# Patient Record
Sex: Female | Born: 1965
Health system: Southern US, Community
[De-identification: ages and names within clinical notes are randomized; demographics above are authoritative.]

## PROBLEM LIST (undated history)

## (undated) DIAGNOSIS — M1990 Unspecified osteoarthritis, unspecified site: Secondary | ICD-10-CM

## (undated) DIAGNOSIS — E785 Hyperlipidemia, unspecified: Secondary | ICD-10-CM

## (undated) DIAGNOSIS — K219 Gastro-esophageal reflux disease without esophagitis: Secondary | ICD-10-CM

## (undated) DIAGNOSIS — C801 Malignant (primary) neoplasm, unspecified: Secondary | ICD-10-CM

## (undated) HISTORY — PX: JOINT REPLACEMENT: SHX530

## (undated) HISTORY — PX: ABDOMINAL HYSTERECTOMY: SHX81

## (undated) HISTORY — PX: BLADDER SURGERY: SHX569

## (undated) HISTORY — PX: NASAL SINUS SURGERY: SHX719

---

## 1998-03-23 ENCOUNTER — Other Ambulatory Visit: Admission: RE | Admit: 1998-03-23 | Discharge: 1998-03-23 | Payer: Self-pay | Admitting: Obstetrics and Gynecology

## 1998-05-05 ENCOUNTER — Other Ambulatory Visit: Admission: RE | Admit: 1998-05-05 | Discharge: 1998-05-05 | Payer: Self-pay | Admitting: *Deleted

## 1998-08-06 ENCOUNTER — Other Ambulatory Visit: Admission: RE | Admit: 1998-08-06 | Discharge: 1998-08-06 | Payer: Self-pay | Admitting: *Deleted

## 1998-11-26 ENCOUNTER — Other Ambulatory Visit: Admission: RE | Admit: 1998-11-26 | Discharge: 1998-11-26 | Payer: Self-pay | Admitting: *Deleted

## 1999-02-17 ENCOUNTER — Encounter: Admission: RE | Admit: 1999-02-17 | Discharge: 1999-02-17 | Payer: Self-pay | Admitting: Family Medicine

## 1999-02-17 ENCOUNTER — Encounter: Payer: Self-pay | Admitting: Gastroenterology

## 1999-04-28 ENCOUNTER — Other Ambulatory Visit: Admission: RE | Admit: 1999-04-28 | Discharge: 1999-04-28 | Payer: Self-pay | Admitting: Obstetrics and Gynecology

## 1999-11-05 ENCOUNTER — Other Ambulatory Visit: Admission: RE | Admit: 1999-11-05 | Discharge: 1999-11-05 | Payer: Self-pay | Admitting: Otolaryngology

## 1999-11-05 ENCOUNTER — Encounter (INDEPENDENT_AMBULATORY_CARE_PROVIDER_SITE_OTHER): Payer: Self-pay | Admitting: Specialist

## 2000-06-01 ENCOUNTER — Other Ambulatory Visit: Admission: RE | Admit: 2000-06-01 | Discharge: 2000-06-01 | Payer: Self-pay | Admitting: Obstetrics and Gynecology

## 2000-08-15 ENCOUNTER — Encounter: Admission: RE | Admit: 2000-08-15 | Discharge: 2000-08-15 | Payer: Self-pay | Admitting: Gastroenterology

## 2000-08-15 ENCOUNTER — Encounter: Payer: Self-pay | Admitting: Gastroenterology

## 2000-08-21 ENCOUNTER — Ambulatory Visit (HOSPITAL_COMMUNITY): Admission: RE | Admit: 2000-08-21 | Discharge: 2000-08-22 | Payer: Self-pay | Admitting: General Surgery

## 2001-06-28 ENCOUNTER — Other Ambulatory Visit: Admission: RE | Admit: 2001-06-28 | Discharge: 2001-06-28 | Payer: Self-pay | Admitting: Obstetrics and Gynecology

## 2002-01-22 ENCOUNTER — Encounter (INDEPENDENT_AMBULATORY_CARE_PROVIDER_SITE_OTHER): Payer: Self-pay

## 2002-01-22 ENCOUNTER — Ambulatory Visit (HOSPITAL_COMMUNITY): Admission: RE | Admit: 2002-01-22 | Discharge: 2002-01-22 | Payer: Self-pay | Admitting: Obstetrics and Gynecology

## 2002-04-04 ENCOUNTER — Encounter: Payer: Self-pay | Admitting: Gastroenterology

## 2002-04-04 ENCOUNTER — Encounter: Admission: RE | Admit: 2002-04-04 | Discharge: 2002-04-04 | Payer: Self-pay | Admitting: Gastroenterology

## 2003-06-11 ENCOUNTER — Other Ambulatory Visit: Admission: RE | Admit: 2003-06-11 | Discharge: 2003-06-11 | Payer: Self-pay | Admitting: Obstetrics and Gynecology

## 2004-10-13 ENCOUNTER — Ambulatory Visit: Payer: Self-pay | Admitting: Internal Medicine

## 2006-12-27 ENCOUNTER — Ambulatory Visit: Payer: Self-pay | Admitting: Internal Medicine

## 2007-01-08 ENCOUNTER — Ambulatory Visit: Payer: Self-pay | Admitting: Internal Medicine

## 2007-12-12 ENCOUNTER — Ambulatory Visit: Payer: Self-pay | Admitting: Internal Medicine

## 2008-09-05 ENCOUNTER — Encounter (INDEPENDENT_AMBULATORY_CARE_PROVIDER_SITE_OTHER): Payer: Self-pay | Admitting: Obstetrics and Gynecology

## 2008-09-05 ENCOUNTER — Ambulatory Visit (HOSPITAL_COMMUNITY): Admission: RE | Admit: 2008-09-05 | Discharge: 2008-09-06 | Payer: Self-pay | Admitting: Obstetrics and Gynecology

## 2008-09-24 ENCOUNTER — Ambulatory Visit: Admission: RE | Admit: 2008-09-24 | Discharge: 2008-09-24 | Payer: Self-pay | Admitting: Gynecologic Oncology

## 2010-06-01 ENCOUNTER — Other Ambulatory Visit (HOSPITAL_COMMUNITY): Payer: Self-pay

## 2010-06-03 ENCOUNTER — Encounter (HOSPITAL_COMMUNITY)
Admission: RE | Admit: 2010-06-03 | Discharge: 2010-06-03 | Disposition: A | Discharge status: Home / Self Care | Payer: BC Managed Care – PPO | Source: Ambulatory Visit | Attending: Obstetrics and Gynecology | Admitting: Obstetrics and Gynecology

## 2010-06-03 DIAGNOSIS — Z01812 Encounter for preprocedural laboratory examination: Secondary | ICD-10-CM | POA: Insufficient documentation

## 2010-06-03 LAB — CBC
HCT: 44 % (ref 36.0–46.0)
Hemoglobin: 14.8 g/dL (ref 12.0–15.0)
MCH: 31 pg (ref 26.0–34.0)
MCHC: 33.6 g/dL (ref 30.0–36.0)
MCV: 92.2 fL (ref 78.0–100.0)
Platelets: 319 10*3/uL (ref 150–400)
RBC: 4.77 MIL/uL (ref 3.87–5.11)
RDW: 12.7 % (ref 11.5–15.5)
WBC: 8.6 10*3/uL (ref 4.0–10.5)

## 2010-06-08 ENCOUNTER — Ambulatory Visit (HOSPITAL_COMMUNITY)
Admission: RE | Admit: 2010-06-08 | Discharge: 2010-06-08 | Disposition: A | Discharge status: Home / Self Care | Payer: BC Managed Care – PPO | Source: Ambulatory Visit | Attending: Obstetrics and Gynecology | Admitting: Obstetrics and Gynecology

## 2010-06-08 DIAGNOSIS — N393 Stress incontinence (female) (male): Secondary | ICD-10-CM | POA: Insufficient documentation

## 2010-06-08 DIAGNOSIS — Z90710 Acquired absence of both cervix and uterus: Secondary | ICD-10-CM | POA: Insufficient documentation

## 2010-06-18 NOTE — H&P (Signed)
  NAMEWILLY, VORCE               ACCOUNT NO.:  [id number]  MEDICAL RECORD NO.:  [id number]          PATIENT TYPE:  AMB  LOCATION:  [facility]                           FACILITY:  [facility]  PHYSICIAN:  Lenoard Aden, M.D.DATE OF BIRTH:  Mar 13, 1966  DATE OF ADMISSION:  06/08/2010 DATE OF DISCHARGE:                             HISTORY & PHYSICAL   CHIEF COMPLAINT:  Stress urinary incontinence documented by urodynamics.  HISTORY OF PRESENT ILLNESS:  She is a 45 year old white female G3, P2 with a history of an uncomplicated total laparoscopic hysterectomy for cervical adenocarcinoma in situ with an uncomplicated recovery, who presents now with worsening socially unacceptable stress urinary incontinence.  Stress urinary incontinence was documented and confirmed by urodynamics performed within the last month.  She has allergies to PENICILLIN.  Her medications were Topamax, magnesium, vitamin B12, vitamin D, and Zoloft.She has a family history of heart disease, diabetes, thyroid dysfunction, muscular dystrophy, migraine headaches, and hypertension.  Her obstetric history is remarkable for 3 pregnancies, 2 vaginal deliveries, 1 miscarriage, appendectomy, and laparoscopic hysterectomy as noted.  PHYSICAL EXAMINATION:  GENERAL:  She is a well-developed, well-nourished white female, in no acute distress. HEENT:  Normal. LUNGS:  Clear. HEART:  Regular rhythm. ABDOMEN:  Soft, nontender. PELVIC:  Reveals well-healed vaginal cuff.  No adnexal masses. EXTREMITIES:  There were no cords. NEUROLOGIC:  Nonfocal. SKIN:  Intact.  IMPRESSION:  Documented stress urinary incontinence.  PLAN:  To proceed with cystoscopy and TVT retropubic sling.  Risks of anesthesia, infection, bleeding, injury to abdominal organ with need for repair was discussed.  Delayed versus immediate complications to include bowel and possible bladder injury with need for repair is noted, possible postoperative evidence of  urinary retention and new-onset detrusor instability is discussed.  The patient acknowledges and wishes to proceed.     Lenoard Aden, M.D.     RJT/MEDQ  D:  06/07/2010  T:  06/08/2010  Job:  [id number]  Electronically Signed by Olivia Mackie M.D. on 06/18/2010 11:14:07 AM

## 2010-06-18 NOTE — Op Note (Signed)
NAME:  Julia [name], Julia [name]               ACCOUNT NO.:  [id number]  MEDICAL RECORD NO.:  [id number]           PATIENT TYPE:  O  LOCATION:  [facility]                          [facility]ILITY[facility]  PHYSICIAN:  Lenoard Aden, M.D.DATE OF BIRTH:  1965/12/24  DATE OF PROCEDURE:  06/08/2010 DATE OF DISCHARGE:                              OPERATIVE REPORT   PREOPERATIVE DIAGNOSIS:  Stress urinary incontinence.  POSTOPERATIVE DIAGNOSIS:  Stress urinary incontinence.  PROCEDURE:  TVT EXACT with cystoscopy.  SURGEON:  Lenoard Aden, MD  ASSISTANT:  Darryl Nestle, MD  ANESTHESIA:  General and local.  ESTIMATED BLOOD LOSS:  Less than 50 mL.  COMPLICATIONS:  None.  DRAINS:  Foley.  COUNTS:  Correct.  The patient went to recovery in good condition.  BRIEF OPERATIVE NOTE:  After being apprised of risks of anesthesia, infection, bleeding, injury to abdominal organs, need for repair, delayed versus immediate complications include bowel and bladder injury, possible need for repair, the patient was brought to the operating room and was administered general anesthetic without complications, prepped and draped in usual sterile fashion.  A 16-French Foley catheter was placed to open drainage.  At this time, exam under anesthesia reveals no evidence of cystocele, previously noted hysterectomy.  At this time, the suprapubic bone is identified and two stab incisions are made after placement of a local anesthetic, 2 cm lateral and 2 cm above, two stab incisions.  The mid urethral point is found vaginally and identified. This area was infiltrated with dilute Marcaine and saline solution, similar 10 mL on each side on the abdomen was placed as well.  At this time, a small 1-cm incision was made in vagina after placement of two Allis clamps to tent the tissue.  Dissection was performed on both sides along the pubovesical cervical fascia suburethrally, laterally until the tips of the scissors reached the  pubic bone on both sides.  This area has also been infiltrated with 10 mL of the dilute Marcaine solution and further dilution is done under the pubic bone from the vaginal approach bilaterally, an additional 10 mL for a total placement of 70 mL total. At this time, the Foley catheter guide is placed and deviated to the right.  The TVT EXACT was used and placement of the transvaginal mesh was done to the right through the space of Retzius behind the symphysis pubis and through the stab incision in the abdomen.  This was clamped and the same procedure was done on the right side aiming towards the right incision.  The left incision on the left showed after replacement of the catheter deviation to the left.  At this time, the catheter was removed.  Urine is clear, indigo carmine is placed IV, and the 70-degree cystoscope was placed.  Bilateral normal trigones were noted with normal efflux of urine bilaterally.  The bladder dome is inspected at 360 degrees and upon manipulation of the mesh on both sides, there is no evidence of any sort of defect in the bladder nor any rents in the muscularis noted.  At this time, the cystoscope was then removed. Bladder was emptied  and a TVT is seated in a standard fashion.  There is placement of Mayo scissors behind the tape to assure that is tension free, and the plastic is removed from around the mesh, the mesh was cut at the layer of the both stab incisions in the abdomen and placement is satisfactory in the mid urethral location in a tension-free manner.  The incisions of the abdomen are closed with Dermabond, and the incision in the vagina was closed using a 2-0 Vicryl in a continuous running fashion.  Good hemostasis was noted.  The patient tolerated the procedure well.  Foley catheter was placed.  She was transferred to recovery in good condition.     Lenoard Aden, M.D.     RJT/MEDQ  D:  06/08/2010  T:  06/09/2010  Job:   [id number]  Electronically Signed by Olivia Mackie M.D. on 06/18/2010 11:14:11 AM

## 2010-08-03 LAB — CBC
HCT: 36 % (ref 36.0–46.0)
HCT: 43 % (ref 36.0–46.0)
Hemoglobin: 12.7 g/dL (ref 12.0–15.0)
Hemoglobin: 15 g/dL (ref 12.0–15.0)
MCHC: 35 g/dL (ref 30.0–36.0)
MCV: 93.2 fL (ref 78.0–100.0)
Platelets: 290 10*3/uL (ref 150–400)
RBC: 3.83 MIL/uL — ABNORMAL LOW (ref 3.87–5.11)
RBC: 4.62 MIL/uL (ref 3.87–5.11)
RDW: 13.2 % (ref 11.5–15.5)
RDW: 13.5 % (ref 11.5–15.5)
WBC: 10 10*3/uL (ref 4.0–10.5)
WBC: 18.1 10*3/uL — ABNORMAL HIGH (ref 4.0–10.5)

## 2010-08-03 LAB — HCG, SERUM, QUALITATIVE: Preg, Serum: NEGATIVE

## 2010-09-07 NOTE — H&P (Signed)
NAME:  Julia [name], BROWNLEY               ACCOUNT NO.:  [id number]   MEDICAL RECORD NO.:  [id number]        PATIENT TYPE:  WOIB   LOCATION:                                [facility]ILITY:  [facility]   PHYSICIAN:  Lenoard Aden, M.D.DATE OF BIRTH:  January 10, 1966   DATE OF ADMISSION:  09/05/2008  DATE OF DISCHARGE:  09/06/2008                              HISTORY & PHYSICAL   CHIEF COMPLAINT:  Atypical glandular cells, questionable endocervical  versus endometrial neoplasm for definitive therapy.   The patient is on chronic conservative therapy in the form of  hysteroscopy, D&C, biopsy.  She has a history of abnormal Pap smear with  abnormal glandular cells.  Endometrial biopsy and colposcopy with [facility]  were definitive for abnormal glandular cells; however, there was  inability to localize this abnormality to either the lower uterine  segments or the endocervix.  Therefore, a plan was made for future  therapy and questionably definitive therapy to include hysterectomy and  ovarian conservation.   ALLERGIES:  PENICILLIN.   MEDICATIONS:  Topamax, magnesium, vitamin B12, vitamin D, Zoloft as  needed.   FAMILY HISTORY:  She has a family history of heart disease, diabetes,  thyroid dysfunction, muscular dystrophy, migraine headache, and  hypertension.   SURGICAL HISTORY:  She has a history of 3 pregnancies, 2 vaginal  deliveries, 1 miscarriage.  History of appendectomy.   PHYSICAL EXAMINATION:  GENERAL:  She is well developed, well nourished  white female, in no acute distress.  HEENT:  Normal.  LUNGS:  Clear.  HEART:  Regular rhythm.  ABDOMEN:  Soft, nontender.  PELVIC EXAM:  A retroflexed uterus, bulky, mobile.  No adnexal masses  appreciated.  EXTREMITIES:  No cords.  NEUROLOGIC:  Nonfocal.  SKIN:  Intact.   IMPRESSION:  Endocervical versus endometrial neoplasia versus  hyperplasia versus precancerous abnormality__________ therapy.   PLAN:  Possible TLH, possible LIVH with risks of  anesthesia, infection,  bleeding, injury to abdominal organs and need for repair discussed.  Inability to cure cancer discussed.  The patient acknowledges, wishes to  proceed.      Lenoard Aden, M.D.  Electronically Signed     RJT/MEDQ  D:  09/04/2008  T:  09/05/2008  Job:  [id number]

## 2010-09-07 NOTE — Op Note (Signed)
[name], [name]               ACCOUNT NO.:  [id number]   MEDICAL RECORD NO.:  [id number]          PATIENT TYPE:  OIB   LOCATION:  9316                          [facility]ILITY:  [facility]   PHYSICIAN:  Lenoard Aden, M.D.DATE OF BIRTH:  12-08-1965   DATE OF PROCEDURE:  09/05/2008  DATE OF DISCHARGE:                               OPERATIVE REPORT   PREOPERATIVE DIAGNOSIS:  Questionable endometrial hyperplasia/carcinoma  versus endocervical dysplasia versus carcinoma.   POSTOPERATIVE DIAGNOSIS:  Questionable endometrial hyperplasia/carcinoma  versus endocervical dysplasia versus carcinoma.   PROCEDURE:  Total laparoscopic hysterectomy.   SURGEON:  Lenoard Aden, MD   ASSISTANT:  Julia Del, MD   ANESTHESIA:  General.   ESTIMATED BLOOD LOSS:  Less than 50 mL.   COMPLICATIONS:  None.   DRAINS:  Foley counts correct.  The patient recovery in good condition.   SPECIMEN:  Uterus and cervix to pathology and cul-de-sac.  Specimen sent  as well.   BRIEF OPERATIVE NOTE:  After being apprised the risks of anesthesia,  infection, bleeding, injury to abdominal organs, need for repair,  delayed versus immediate complications to include bowel and bladder  injury, inability to potentially exert cancer.  The patient was brought  to the operating room, where she was administered a general anesthetic  without complications, prepped and draped in usual sterile fashion.  Foley catheter placed.  After achieving adequate anesthesia, dilute  Marcaine solution placed.  The Rumi retractor was placed per vagina in a  standard fashion.  Infraumbilical incision then made with a scalpel.  Veress needle placed.  Opening pressure -2 noted 4 L CO2 insufflated  without difficulty.  Trocar placed atraumatically.  Visualization of the  abdomen reveals a normal-appearing uterus, normal-appearing  cervicovaginal junction, normal anterior and posterior cul-de-sac with a  small spherical cul-de-sac  growth posteriorly.  Normal tubes, normal  ovaries, and ureters were seen to be peristalsing bilaterally.  Two 5-mm  trocar sites were made in the left and right lower quadrants under  direct visualization and transillumination.  After placing two 5-mm  trocars, the cul-de-sac mass was removed through a 5-mm port and will be  sent with the pathological specimen.  There is no evidence of any  metastatic disease there.  The omentum appears clear.  Normal liver,  gallbladder bed, normal diaphragm.  Appendix is surgically removed  previously.  At this time, the round ligaments were bilaterally grasped  and ligated using the LigaSure device and bladder flap was developed  sharply after noting the ureters bilaterally be normal.  At this time,  the tubo-ovarian ligament is grasped and ligated bilaterally.  Uterine  vessels were skeletonized.  Bladder flap was further developed.  Uterine  vessels were then clamped and divided bilaterally using the LigaSure  device.  The posterior cul-de-sac appears clear.  The LigaSure pencil  was then used as an electrocautery device to score the cervicovaginal  junction and detach the specimen, which is then retracted to the vagina  intact.  At this time, vaginal cuff hemostasis was achieved using  electrocautery.  Ureters appear normal bilaterally.  The vaginal  cuff  was then closed using multiple interrupted PDS sutures.  Vaginal exam  reveals the cuff to be intact.  Good hemostasis was noted.  At this  time, the irrigation was accomplished.  Trendelenburg was reversed and  visualization of the cuff  all again appears to be intact.  Specimen has  been removed vaginally and sent to pathology for permanent dissection at  this time.  Good hemostasis was noted.  Pictures were taken.  Normal  tubes remain.  Normal ovaries remain.  At this time, all instruments  removed under direct visualization.  CO2 was released.  Incisions were  closed using 0 Vicryl and  Dermabond.  Good hemostasis was noted.  The  patient tolerated the procedure well and was transferred to recovery in  good condition.       Lenoard Aden, M.D.  Electronically Signed     RJT/MEDQ  D:  09/05/2008  T:  09/06/2008  Job:  [id number]

## 2010-09-07 NOTE — Consult Note (Signed)
Julia [name], Julia [name]               ACCOUNT NO.:  [id number]   MEDICAL RECORD NO.:  [id number]          PATIENT TYPE:  OUT   LOCATION:  GYN                          FACILITY:  [facility]   PHYSICIAN:  John T. Kyla Balzarine, M.D.    DATE OF BIRTH:  06-07-1965   DATE OF CONSULTATION:  09/24/2008  DATE OF DISCHARGE:                                 CONSULTATION   REFERRING PHYSICIAN:  Lenoard Aden, M.D.   CHIEF COMPLAINT:  This 45 year old woman is seen at the request of Dr.  Billy Coast to discuss management of cervical adenocarcinoma in situ.   HISTORY OF PRESENT ILLNESS:  This patient relates intermittent abnormal  cytology over many years' duration but no prior treatment for dysplasia.  Records from Dr. Billy Coast are reviewed and she had cytology on April 20  with a mixed pattern of ASC-US and glandular atypia suspicious for  endocervical abnormalities.  High-risk HPV was detected.  Ultrasound of  the uterus and adnexa revealed no abnormalities.  Evaluation comprised  colposcopy with ECC and endometrial biopsies.  She had atypical  glandular cells on the endocervical canal which might have originated in  the endometrium or endocervix and these were suspicious for neoplastic  process.  Endometrial biopsy was negative.  Without cervical conization  or further evaluation, the patient was taken for laparoscopic  hysterectomy which was performed on May 14.  She was found to have a  noninvasive endocervical adenocarcinoma in situ and a small cul-de-sac  nodule was excised with degenerative changes comprising desquamated  squamous cells and rare glandular fragment thought to be endocervical in  origin.  Of note, the patient has a longstanding history of Mirena IUD  use.  The cul-de-sac mass was benign.   PAST MEDICAL HISTORY:  Significant for:  1. Hypertension.  2. Low grade depression.  3. Prior appendectomy.  4. NSVD x2 and SAB x1.   MEDICATIONS:  Topamax, vitamin B12, vitamin D, Zoloft,  magnesium.   ALLERGIES:  PENICILLIN.   FAMILY HISTORY:  No breast, gynecologic or ovarian malignancies known.   REVIEW OF SYSTEMS:  Otherwise negative other than acute convalescence  from surgery.   EXAM:  VITAL SIGNS:  Stable and afebrile, recorded with weight 163  pounds and blood pressure 120/80.  In lieu of examination, I spent in  excess of 40 minutes in face-to-face conversation with the patient  regarding the etiology of her disease, its differentiation from truly  invasive cervical adenocarcinoma and our recommended followup.   ASSESSMENT:  Endocervical adenocarcinoma in situ, noninvasive.   PLAN AND RECOMMENDATIONS:  At this juncture, no further treatment is  needed.  The patient remains at a low risk (1-2%) for recurrence.  Fortunately, she did not have an invasive lesion, as simple hysterectomy  would have been inadequate treatment mode and would have exposed her to  the risk of additional treatment in the form of radical parametrectomy  or radiation therapy; thus the [employer] of Gynecology  recommendation for cervical conization prior to hysterectomy in a  patient with atypical glandular cells on cytology.  We would recommend  followup at  3-month intervals with pelvic examination and cytology to  complete at least 3 years followup before reverting to annual cytology  and would recommend annual cytology indefinitely.  Followup would best  be performed under Dr. Jorene Minors auspices but we would be glad to see the  patient at any time on an as needed basis if there are questions  regarding her subsequent management.      John T. Kyla Balzarine, M.D.  Electronically Signed     JTS/MEDQ  D:  09/24/2008  T:  09/24/2008  Job:  [id number]   cc:   Lenoard Aden, M.D.  Fax: [phone]   Telford Nab, R.N.  [address]  [place], [place] [address]

## 2010-09-10 NOTE — Discharge Summary (Signed)
NAMEPEARSON, REASONS               ACCOUNT NO.:  [id number]   MEDICAL RECORD NO.:  [id number]          PATIENT TYPE:  OIB   LOCATION:  9316                          [facility]ACILITY:  [facility]   PHYSICIAN:  Lenoard Aden, M.D.DATE OF BIRTH:  07-Nov-1965   DATE OF ADMISSION:  09/05/2008  DATE OF DISCHARGE:  09/06/2008                               DISCHARGE SUMMARY   PREOPERATIVE DIAGNOSES:  Atypical glandular cells, questionable atypical  endometrial hyperplasia versus endometrial adenocarcinoma in situ.   POSTOPERATIVE DIAGNOSIS:  See pathology report.   HOSPITAL COURSE:  Patient presented for TLH and definitive therapy.Given  the findings of atypical endometrial versus endocervical cells with a  normal ECC and a normal endometrial biopsy, options were discussed with  the patient regarding management. Recommendation was initially made to  perform cervical conization with hysteroscopy D and C per ACOG  guidelines. The patient declined hysteroscopy and conization.  Case was  also discussed with Pathology(Smir). He noted that he was suspicious for  Lower uterine segment lesion as opposed to a endocervical lesion. He  suggested that cervical conization might be diagnostically inadequate.  Decision was made to proceed with laparoscopic hysterectomy based on  suggestions of pathology and patient preference. Pt understood that  further surgery and possible staging might be necessary. Pt declined  preoperative GYN Onc consult.  The patient underwent uncomplicated total  laparoscopic hysterectomy on Sep 05, 2008.  Postoperative course is  uncomplicated.  Tolerated the diet well.  Hemoglobin and hematocrit  within normal limits.  Discharged on postop day #1 to home.  Discharge  teaching done. Post op GYN Onc consult obtained.   DISCHARGE MEDICATIONS:  Percocet for pain.   FOLLOWUP:  Follow up in the office is scheduled within 2 weeks.  Follow  up with GYN/Oncology as noted.      Lenoard Aden, M.D.  Electronically Signed     RJT/MEDQ  D:  10/20/2008  T:  10/21/2008  Job:  [id number]

## 2010-09-10 NOTE — H&P (Signed)
   NAME:  Julia [name], Julia [name]                         ACCOUNT NO.:  [id number]   MEDICAL RECORD NO.:  [id number]                   PATIENT TYPE:  AMB   LOCATION:  [facility]                                  [facility]ILITY:  [facility]   PHYSICIAN:  Lenoard Aden, M.D.             DATE OF BIRTH:  10-06-1965   DATE OF ADMISSION:  01/22/2002  DATE OF DISCHARGE:                                HISTORY & PHYSICAL   CHIEF COMPLAINT:  Missed AB.   HISTORY OF PRESENT ILLNESS:  The patient is a 45 year old white female G2,  P2, EDD April 22 reported 10 weeks with missed AB.   ALLERGIES:  PENICILLIN.   PAST MEDICAL HISTORY:  History of vaginal delivery x2.  History of  postpartum depression previously on Zoloft.  History of appendectomy and  sinus surgery.   FAMILY HISTORY:  Myocardial infarction, hypertension, adult-onset diabetes,  thyroid dysfunction.   PRENATAL LABORATORY DATA:  Blood type A+.   PHYSICAL EXAMINATION:  GENERAL:  She is a well-developed, well-nourished  white female in no apparent distress.  HEENT:  Normal.  LUNGS:  Clear.  HEART:  Regular rate and rhythm.  ABDOMEN:  Soft, nontender.  PELVIC:  A 10 week sized retroflexed uterus and no adnexal masses.   IMPRESSION:  A 10 week intrauterine pregnancy with fetal demise.   PLAN:  Proceed with suction D&E, tissue for chromosomes.  Risks of  anesthesia, infection, bleeding, uterine perforation with need for repair is  discussed.  Delayed versus immediate complications to include bowel and  bladder injury are noted.  The patient acknowledges and wishes to proceed.                                               Lenoard Aden, M.D.    RJT/MEDQ  D:  01/22/2002  T:  01/22/2002  Job:  [id number]   cc:   [facility]

## 2010-09-10 NOTE — Op Note (Signed)
   NAME:  Julia [name], Julia [name]                         ACCOUNT NO.:  [id number]   MEDICAL RECORD NO.:  [id number]                   PATIENT TYPE:  AMB   LOCATION:  [facility]C                                  [facility]ILITY:  [facility]   PHYSICIAN:  Lenoard Aden, M.D.             DATE OF BIRTH:  1965/05/12   DATE OF PROCEDURE:  01/22/2002  DATE OF DISCHARGE:                                 OPERATIVE REPORT   PREOPERATIVE DIAGNOSES:  1. A 10 week fetal demise.  2. Cystic hygroma.   POSTOPERATIVE DIAGNOSES:  1. A 10 week fetal demise.  2. Cystic hygroma.   PROCEDURE:  Suction dilatation and evacuation.   SURGEON:  Lenoard Aden, M.D.   ANESTHESIA:  General.   ESTIMATED BLOOD LOSS:  Less than 150 cc.   COMPLICATIONS:  None.   DRAINS:  None.   COUNTS:  Correct.   DISPOSITION:  The patient to recovery in good condition.   BRIEF OPERATIVE NOTE:  After being apprised of the risks of anesthesia,  infection, bleeding, uterine perforation with need for repair the patient  was brought to the operating room where she was administered general  anesthetic without complications.  Prepped and draped in usual sterile  fashion.  Catheterized until the bladder is empty.  Examination under  anesthesia was a 10 week mid positioned uterus.  No adnexal masses.  Cervix  dilates easily up to a number 31 Pratt dilator.  A 10 mm suction curette  placed.  Products of conception noted upon aspiration and tissue collected  and sent for chromosomes.  Blunt curettage in a four quadrant method reveals  cavity to be empty.  Repaet suction curettage confirms.  At this time good  hemostasis achieved.  All instruments removed from the vagina.  Please note that a paracervical block using 20 cc of a 1% Xylocaine solution  was placed in the beginning of the procedure in a standard fashion.  The  patient tolerates procedure well and is returned to the recovery room in  good condition.              Lenoard Aden, M.D.    RJT/MEDQ  D:  01/22/2002  T:  01/22/2002  Job:  [id number]   cc:   [facility]

## 2010-09-10 NOTE — Op Note (Signed)
[place]. [facility]  Patient:    Julia [name], Julia [name]                      MRN: [id number] Proc. Date: 08/21/00 Adm. Date:  [id number] Attending:  Arlis Porta CC:         Anselmo Rod, M.D.  Dr. Benetta Spar, [place]   Operative Report  PREOPERATIVE DIAGNOSIS:  Chronic right lower quadrant pain.  POSTOPERATIVE DIAGNOSIS:  Chronic right lower quadrant pain.  OPERATION PERFORMED:  Diagnostic laparoscopy with lysis of adhesions and appendectomy.  SURGEON:  Adolph Pollack, M.D.  ANESTHESIA:  General.  INDICATIONS FOR PROCEDURE:  Julia [name] is a 45 year old female who has been having some intermittent severe episodes of right lower quadrant pain that radiates around to the back.  It is crampy in nature.  She has had a fairly significant evaluation including pelvic ultrasound which did not show any abnormalities.  She did undergo a CT scan which demonstrated an abnormal-appearing appendix in that it was dilated and thickened.  She now presents for diagnostic laparoscopy and appendectomy.  Potentials include chronic inflammatory changes of the appendix as well as mucocele of the appendix.  OPERATIVE FINDINGS:  There were adhesions of the distal ileum to the right pelvic side-wall leading to a fairly acute angle before the distal ileum joined with the cecum.  The appendix was somewhat thickened but did not appear to be acutely inflamed.  The ovaries and fallopian tubes appeared normal.  The distal three feet of the ileum were examined and no abnormalities were noted.  DESCRIPTION OF PROCEDURE:  She was placed supine on the operating table and a general anesthetic was administered.  A Foley catheter was placed in the bladder.  The abdomen was sterilely prepped and draped.  Local and anesthetic consisting of 0.5% plain Marcaine was infiltrated in the subumbilical region and a small subumbilical incision was made.  The subcutaneous tissue  was dissected bluntly.  A 1 cm incision was made in the midline fascia and the peritoneal cavity was entered then sharply and under direct vision.  A pursestring suture of 0 Vicryl was placed around the fascial edges.  A Hasson trocar was introduced into the peritoneal cavity and a pneumoperitoneum was created by insufflation of CO2 gas.  Next, a laparoscope was introduced.  I placed the patient positioned in a steep Trendelenburg position and rotated the right side up.  I used a laparoscope to initially visualize what were adhesions of the distal ileum to the pelvic side wall leading to a rather acute angle as the ileum entered and joined with the cecum.  I subsequently made a 5 mm incision in the left lower quadrant area and placed a 5 mm trocar through this.  I manipulated the right ovary and tube and these appeared normal.  I began lysing the adhesions to free up the distal ileum from its lateral pelvic side-wall attachments.  I then identified the appendix and it appeared somewhat thickened but did not appear to be acutely inflamed.  I made one more 5 mm incision in the lower midline and added a second 5 mm trocar.  I subsequently divided part of the mesoappendix, then amputated the appendix from the cecum.  I divided the rest of the mesoappendix with a Harmonic scalpel.  I placed the appendix in an Endo pouch bag, removed it and sent it off for pathology.  Next, I inspected the area and beginning at  the ileocecal valve which I had now straightened out and running it back three feet and did not notice any abnormalities.  I examined the uterus and the left fallopian tube and the ovary and these all appeared to be normal.  The sigmoid colon did not appear to be diseased.  I irrigated out the abdominal cavity and evacuated the fluid and no bleeding was noted.  A subsequently removed all the trocars and released the pneumoperitoneum.  I closed the subumbilical fascial defect  by tightening up and tying down the pursestring suture.  The skin incisions were closed with 4-0 Monocryl subcuticular stitches followed by Steri-Strips and sterile dressings.  The patient tolerated the procedure well without any apparent complications and was taken to the recovery room in satisfactory condition. DD:  08/21/00 TD:  08/21/00 Job: [id number]

## 2010-10-20 ENCOUNTER — Other Ambulatory Visit: Payer: Self-pay | Admitting: Obstetrics and Gynecology

## 2012-05-20 ENCOUNTER — Other Ambulatory Visit: Payer: Self-pay | Admitting: Unknown Physician Specialty

## 2012-05-29 LAB — EXPECTORATED SPUTUM ASSESSMENT W GRAM STAIN, RFLX TO RESP C

## 2012-09-11 ENCOUNTER — Ambulatory Visit: Payer: Self-pay | Admitting: Physician Assistant

## 2012-09-14 ENCOUNTER — Ambulatory Visit: Payer: Self-pay | Admitting: Physician Assistant

## 2012-10-23 ENCOUNTER — Ambulatory Visit: Payer: Self-pay | Admitting: Unknown Physician Specialty

## 2015-10-30 DIAGNOSIS — J310 Chronic rhinitis: Secondary | ICD-10-CM | POA: Insufficient documentation

## 2015-10-30 DIAGNOSIS — H6993 Unspecified Eustachian tube disorder, bilateral: Secondary | ICD-10-CM | POA: Insufficient documentation

## 2016-06-08 DIAGNOSIS — N644 Mastodynia: Secondary | ICD-10-CM | POA: Diagnosis not present

## 2016-06-13 DIAGNOSIS — L820 Inflamed seborrheic keratosis: Secondary | ICD-10-CM | POA: Diagnosis not present

## 2016-06-13 DIAGNOSIS — L700 Acne vulgaris: Secondary | ICD-10-CM | POA: Diagnosis not present

## 2016-06-13 DIAGNOSIS — D229 Melanocytic nevi, unspecified: Secondary | ICD-10-CM | POA: Diagnosis not present

## 2016-06-13 DIAGNOSIS — Z1283 Encounter for screening for malignant neoplasm of skin: Secondary | ICD-10-CM | POA: Diagnosis not present

## 2016-06-14 DIAGNOSIS — N644 Mastodynia: Secondary | ICD-10-CM | POA: Diagnosis not present

## 2016-06-20 DIAGNOSIS — F332 Major depressive disorder, recurrent severe without psychotic features: Secondary | ICD-10-CM | POA: Diagnosis not present

## 2016-06-21 DIAGNOSIS — M1712 Unilateral primary osteoarthritis, left knee: Secondary | ICD-10-CM | POA: Diagnosis not present

## 2016-07-13 DIAGNOSIS — M26621 Arthralgia of right temporomandibular joint: Secondary | ICD-10-CM | POA: Diagnosis not present

## 2016-07-13 DIAGNOSIS — J302 Other seasonal allergic rhinitis: Secondary | ICD-10-CM | POA: Diagnosis not present

## 2016-07-25 DIAGNOSIS — Z0000 Encounter for general adult medical examination without abnormal findings: Secondary | ICD-10-CM | POA: Diagnosis not present

## 2016-07-25 DIAGNOSIS — E785 Hyperlipidemia, unspecified: Secondary | ICD-10-CM | POA: Diagnosis not present

## 2016-07-27 DIAGNOSIS — E784 Other hyperlipidemia: Secondary | ICD-10-CM | POA: Diagnosis not present

## 2016-07-27 DIAGNOSIS — E559 Vitamin D deficiency, unspecified: Secondary | ICD-10-CM | POA: Diagnosis not present

## 2016-07-27 DIAGNOSIS — Z0000 Encounter for general adult medical examination without abnormal findings: Secondary | ICD-10-CM | POA: Diagnosis not present

## 2016-07-27 DIAGNOSIS — F5104 Psychophysiologic insomnia: Secondary | ICD-10-CM | POA: Diagnosis not present

## 2017-01-11 DIAGNOSIS — K760 Fatty (change of) liver, not elsewhere classified: Secondary | ICD-10-CM | POA: Diagnosis not present

## 2017-01-11 DIAGNOSIS — N3281 Overactive bladder: Secondary | ICD-10-CM | POA: Diagnosis not present

## 2017-01-11 DIAGNOSIS — M1712 Unilateral primary osteoarthritis, left knee: Secondary | ICD-10-CM | POA: Diagnosis not present

## 2017-01-11 DIAGNOSIS — G43909 Migraine, unspecified, not intractable, without status migrainosus: Secondary | ICD-10-CM | POA: Diagnosis not present

## 2017-01-11 DIAGNOSIS — F5104 Psychophysiologic insomnia: Secondary | ICD-10-CM | POA: Diagnosis not present

## 2017-01-11 DIAGNOSIS — K219 Gastro-esophageal reflux disease without esophagitis: Secondary | ICD-10-CM | POA: Diagnosis not present

## 2017-01-24 DIAGNOSIS — I10 Essential (primary) hypertension: Secondary | ICD-10-CM | POA: Diagnosis not present

## 2017-01-24 DIAGNOSIS — M1712 Unilateral primary osteoarthritis, left knee: Secondary | ICD-10-CM | POA: Diagnosis not present

## 2017-01-24 DIAGNOSIS — Z6829 Body mass index (BMI) 29.0-29.9, adult: Secondary | ICD-10-CM | POA: Diagnosis not present

## 2017-01-24 DIAGNOSIS — F418 Other specified anxiety disorders: Secondary | ICD-10-CM | POA: Diagnosis not present

## 2017-01-24 DIAGNOSIS — G8918 Other acute postprocedural pain: Secondary | ICD-10-CM | POA: Diagnosis not present

## 2017-01-24 DIAGNOSIS — K760 Fatty (change of) liver, not elsewhere classified: Secondary | ICD-10-CM | POA: Diagnosis not present

## 2017-01-24 DIAGNOSIS — Z87891 Personal history of nicotine dependence: Secondary | ICD-10-CM | POA: Diagnosis not present

## 2017-01-24 DIAGNOSIS — Z8541 Personal history of malignant neoplasm of cervix uteri: Secondary | ICD-10-CM | POA: Diagnosis not present

## 2017-01-24 DIAGNOSIS — E785 Hyperlipidemia, unspecified: Secondary | ICD-10-CM | POA: Diagnosis not present

## 2017-01-24 DIAGNOSIS — K219 Gastro-esophageal reflux disease without esophagitis: Secondary | ICD-10-CM | POA: Diagnosis not present

## 2017-01-24 DIAGNOSIS — E669 Obesity, unspecified: Secondary | ICD-10-CM | POA: Diagnosis not present

## 2017-01-24 DIAGNOSIS — Z90710 Acquired absence of both cervix and uterus: Secondary | ICD-10-CM | POA: Diagnosis not present

## 2017-01-30 DIAGNOSIS — M25562 Pain in left knee: Secondary | ICD-10-CM | POA: Diagnosis not present

## 2017-02-02 DIAGNOSIS — M25562 Pain in left knee: Secondary | ICD-10-CM | POA: Diagnosis not present

## 2017-02-09 DIAGNOSIS — M25562 Pain in left knee: Secondary | ICD-10-CM | POA: Diagnosis not present

## 2017-02-13 DIAGNOSIS — M25562 Pain in left knee: Secondary | ICD-10-CM | POA: Diagnosis not present

## 2017-02-16 DIAGNOSIS — M25562 Pain in left knee: Secondary | ICD-10-CM | POA: Diagnosis not present

## 2017-02-20 DIAGNOSIS — M25562 Pain in left knee: Secondary | ICD-10-CM | POA: Diagnosis not present

## 2017-02-23 DIAGNOSIS — M25562 Pain in left knee: Secondary | ICD-10-CM | POA: Diagnosis not present

## 2017-03-01 DIAGNOSIS — R600 Localized edema: Secondary | ICD-10-CM | POA: Diagnosis not present

## 2017-03-03 DIAGNOSIS — D2261 Melanocytic nevi of right upper limb, including shoulder: Secondary | ICD-10-CM | POA: Diagnosis not present

## 2017-03-10 DIAGNOSIS — M7989 Other specified soft tissue disorders: Secondary | ICD-10-CM | POA: Diagnosis not present

## 2017-03-20 DIAGNOSIS — M25562 Pain in left knee: Secondary | ICD-10-CM | POA: Diagnosis not present

## 2017-03-28 DIAGNOSIS — M25562 Pain in left knee: Secondary | ICD-10-CM | POA: Diagnosis not present

## 2017-03-29 DIAGNOSIS — R600 Localized edema: Secondary | ICD-10-CM | POA: Diagnosis not present

## 2017-03-29 DIAGNOSIS — Z9889 Other specified postprocedural states: Secondary | ICD-10-CM | POA: Diagnosis not present

## 2017-04-06 DIAGNOSIS — R600 Localized edema: Secondary | ICD-10-CM | POA: Diagnosis not present

## 2017-04-06 DIAGNOSIS — Z9889 Other specified postprocedural states: Secondary | ICD-10-CM | POA: Diagnosis not present

## 2017-04-14 DIAGNOSIS — Z9889 Other specified postprocedural states: Secondary | ICD-10-CM | POA: Diagnosis not present

## 2017-04-14 DIAGNOSIS — R600 Localized edema: Secondary | ICD-10-CM | POA: Diagnosis not present

## 2017-04-14 DIAGNOSIS — M25562 Pain in left knee: Secondary | ICD-10-CM | POA: Diagnosis not present

## 2017-04-24 DIAGNOSIS — M791 Myalgia, unspecified site: Secondary | ICD-10-CM | POA: Diagnosis not present

## 2017-04-24 DIAGNOSIS — I10 Essential (primary) hypertension: Secondary | ICD-10-CM | POA: Diagnosis not present

## 2017-04-24 DIAGNOSIS — K760 Fatty (change of) liver, not elsewhere classified: Secondary | ICD-10-CM | POA: Diagnosis not present

## 2017-04-24 DIAGNOSIS — M25562 Pain in left knee: Secondary | ICD-10-CM | POA: Diagnosis not present

## 2017-04-24 DIAGNOSIS — K219 Gastro-esophageal reflux disease without esophagitis: Secondary | ICD-10-CM | POA: Diagnosis not present

## 2017-04-24 DIAGNOSIS — E7849 Other hyperlipidemia: Secondary | ICD-10-CM | POA: Diagnosis not present

## 2017-04-27 DIAGNOSIS — M25562 Pain in left knee: Secondary | ICD-10-CM | POA: Diagnosis not present

## 2017-05-04 DIAGNOSIS — M25562 Pain in left knee: Secondary | ICD-10-CM | POA: Diagnosis not present

## 2017-05-09 DIAGNOSIS — M79641 Pain in right hand: Secondary | ICD-10-CM | POA: Diagnosis not present

## 2017-05-09 DIAGNOSIS — R768 Other specified abnormal immunological findings in serum: Secondary | ICD-10-CM | POA: Insufficient documentation

## 2017-05-09 DIAGNOSIS — M79642 Pain in left hand: Secondary | ICD-10-CM | POA: Diagnosis not present

## 2017-05-09 DIAGNOSIS — Z96652 Presence of left artificial knee joint: Secondary | ICD-10-CM | POA: Diagnosis not present

## 2017-05-09 DIAGNOSIS — M1712 Unilateral primary osteoarthritis, left knee: Secondary | ICD-10-CM | POA: Diagnosis not present

## 2017-05-09 DIAGNOSIS — M2550 Pain in unspecified joint: Secondary | ICD-10-CM | POA: Diagnosis not present

## 2017-05-18 DIAGNOSIS — M1712 Unilateral primary osteoarthritis, left knee: Secondary | ICD-10-CM | POA: Diagnosis not present

## 2017-05-18 DIAGNOSIS — M2550 Pain in unspecified joint: Secondary | ICD-10-CM | POA: Diagnosis not present

## 2017-05-18 DIAGNOSIS — R768 Other specified abnormal immunological findings in serum: Secondary | ICD-10-CM | POA: Diagnosis not present

## 2017-05-22 DIAGNOSIS — K760 Fatty (change of) liver, not elsewhere classified: Secondary | ICD-10-CM | POA: Diagnosis not present

## 2017-05-22 DIAGNOSIS — K219 Gastro-esophageal reflux disease without esophagitis: Secondary | ICD-10-CM | POA: Diagnosis not present

## 2017-05-22 DIAGNOSIS — G43909 Migraine, unspecified, not intractable, without status migrainosus: Secondary | ICD-10-CM | POA: Diagnosis not present

## 2017-05-22 DIAGNOSIS — I10 Essential (primary) hypertension: Secondary | ICD-10-CM | POA: Diagnosis not present

## 2017-07-11 DIAGNOSIS — M25521 Pain in right elbow: Secondary | ICD-10-CM | POA: Diagnosis not present

## 2017-07-11 DIAGNOSIS — M1712 Unilateral primary osteoarthritis, left knee: Secondary | ICD-10-CM | POA: Diagnosis not present

## 2017-07-11 DIAGNOSIS — Z96652 Presence of left artificial knee joint: Secondary | ICD-10-CM | POA: Diagnosis not present

## 2017-07-17 DIAGNOSIS — B9689 Other specified bacterial agents as the cause of diseases classified elsewhere: Secondary | ICD-10-CM | POA: Diagnosis not present

## 2017-07-17 DIAGNOSIS — J019 Acute sinusitis, unspecified: Secondary | ICD-10-CM | POA: Diagnosis not present

## 2017-07-17 DIAGNOSIS — J209 Acute bronchitis, unspecified: Secondary | ICD-10-CM | POA: Diagnosis not present

## 2017-07-20 DIAGNOSIS — M25521 Pain in right elbow: Secondary | ICD-10-CM | POA: Diagnosis not present

## 2017-07-30 DIAGNOSIS — Z96652 Presence of left artificial knee joint: Secondary | ICD-10-CM | POA: Insufficient documentation

## 2017-08-08 DIAGNOSIS — J209 Acute bronchitis, unspecified: Secondary | ICD-10-CM | POA: Diagnosis not present

## 2017-08-17 DIAGNOSIS — R05 Cough: Secondary | ICD-10-CM | POA: Diagnosis not present

## 2017-08-28 DIAGNOSIS — F332 Major depressive disorder, recurrent severe without psychotic features: Secondary | ICD-10-CM | POA: Diagnosis not present

## 2017-09-11 DIAGNOSIS — M1712 Unilateral primary osteoarthritis, left knee: Secondary | ICD-10-CM | POA: Diagnosis not present

## 2017-09-11 DIAGNOSIS — M25562 Pain in left knee: Secondary | ICD-10-CM | POA: Diagnosis not present

## 2017-09-11 DIAGNOSIS — Z96652 Presence of left artificial knee joint: Secondary | ICD-10-CM | POA: Diagnosis not present

## 2017-09-11 DIAGNOSIS — S83412A Sprain of medial collateral ligament of left knee, initial encounter: Secondary | ICD-10-CM | POA: Diagnosis not present

## 2017-09-12 DIAGNOSIS — E559 Vitamin D deficiency, unspecified: Secondary | ICD-10-CM | POA: Diagnosis not present

## 2017-09-12 DIAGNOSIS — E7849 Other hyperlipidemia: Secondary | ICD-10-CM | POA: Diagnosis not present

## 2017-09-12 DIAGNOSIS — M84374A Stress fracture, right foot, initial encounter for fracture: Secondary | ICD-10-CM | POA: Diagnosis not present

## 2017-09-12 DIAGNOSIS — M79671 Pain in right foot: Secondary | ICD-10-CM | POA: Diagnosis not present

## 2017-09-12 DIAGNOSIS — I10 Essential (primary) hypertension: Secondary | ICD-10-CM | POA: Diagnosis not present

## 2017-09-19 DIAGNOSIS — K219 Gastro-esophageal reflux disease without esophagitis: Secondary | ICD-10-CM | POA: Diagnosis not present

## 2017-09-19 DIAGNOSIS — K760 Fatty (change of) liver, not elsewhere classified: Secondary | ICD-10-CM | POA: Diagnosis not present

## 2017-09-19 DIAGNOSIS — Z0000 Encounter for general adult medical examination without abnormal findings: Secondary | ICD-10-CM | POA: Diagnosis not present

## 2017-09-19 DIAGNOSIS — I10 Essential (primary) hypertension: Secondary | ICD-10-CM | POA: Diagnosis not present

## 2017-09-22 DIAGNOSIS — F332 Major depressive disorder, recurrent severe without psychotic features: Secondary | ICD-10-CM | POA: Diagnosis not present

## 2017-10-06 DIAGNOSIS — F332 Major depressive disorder, recurrent severe without psychotic features: Secondary | ICD-10-CM | POA: Diagnosis not present

## 2017-10-06 DIAGNOSIS — Z1231 Encounter for screening mammogram for malignant neoplasm of breast: Secondary | ICD-10-CM | POA: Diagnosis not present

## 2017-10-20 DIAGNOSIS — F332 Major depressive disorder, recurrent severe without psychotic features: Secondary | ICD-10-CM | POA: Diagnosis not present

## 2017-11-02 ENCOUNTER — Other Ambulatory Visit: Payer: Self-pay | Admitting: Family Medicine

## 2017-11-02 DIAGNOSIS — R410 Disorientation, unspecified: Secondary | ICD-10-CM

## 2017-11-02 DIAGNOSIS — J019 Acute sinusitis, unspecified: Secondary | ICD-10-CM | POA: Diagnosis not present

## 2017-11-02 DIAGNOSIS — H5203 Hypermetropia, bilateral: Secondary | ICD-10-CM | POA: Diagnosis not present

## 2017-11-02 DIAGNOSIS — R519 Headache, unspecified: Secondary | ICD-10-CM

## 2017-11-02 DIAGNOSIS — R51 Headache: Principal | ICD-10-CM

## 2017-11-03 ENCOUNTER — Ambulatory Visit
Admission: RE | Admit: 2017-11-03 | Discharge: 2017-11-03 | Disposition: A | Discharge status: Home / Self Care | Payer: BLUE CROSS/BLUE SHIELD | Source: Ambulatory Visit | Attending: Family Medicine | Admitting: Family Medicine

## 2017-11-03 DIAGNOSIS — R51 Headache: Secondary | ICD-10-CM | POA: Insufficient documentation

## 2017-11-03 DIAGNOSIS — R410 Disorientation, unspecified: Secondary | ICD-10-CM | POA: Diagnosis not present

## 2017-11-03 DIAGNOSIS — R519 Headache, unspecified: Secondary | ICD-10-CM

## 2017-11-09 ENCOUNTER — Emergency Department
Admission: EM | Admit: 2017-11-09 | Discharge: 2017-11-09 | Disposition: A | Discharge status: Home / Self Care | Payer: BLUE CROSS/BLUE SHIELD | Attending: Emergency Medicine | Admitting: Emergency Medicine

## 2017-11-09 ENCOUNTER — Emergency Department: Payer: BLUE CROSS/BLUE SHIELD

## 2017-11-09 ENCOUNTER — Encounter: Payer: Self-pay | Admitting: *Deleted

## 2017-11-09 ENCOUNTER — Other Ambulatory Visit: Payer: Self-pay

## 2017-11-09 DIAGNOSIS — F332 Major depressive disorder, recurrent severe without psychotic features: Secondary | ICD-10-CM | POA: Diagnosis not present

## 2017-11-09 DIAGNOSIS — R0989 Other specified symptoms and signs involving the circulatory and respiratory systems: Secondary | ICD-10-CM | POA: Insufficient documentation

## 2017-11-09 DIAGNOSIS — Z87891 Personal history of nicotine dependence: Secondary | ICD-10-CM | POA: Diagnosis not present

## 2017-11-09 DIAGNOSIS — R221 Localized swelling, mass and lump, neck: Secondary | ICD-10-CM | POA: Diagnosis not present

## 2017-11-09 MED ORDER — LIDOCAINE VISCOUS HCL 2 % MT SOLN
OROMUCOSAL | Status: AC
  Filled 2017-11-09: qty 15

## 2017-11-09 MED ORDER — GLUCAGON HCL (RDNA) 1 MG IJ SOLR: 1.0000 mg | Freq: Once | INTRAMUSCULAR | Status: DC

## 2017-11-09 MED ORDER — ONDANSETRON HCL 4 MG/2ML IJ SOLN: 4.0000 mg | Freq: Once | INTRAMUSCULAR | Status: DC

## 2017-11-09 MED ORDER — LIDOCAINE HCL (PF) 1 % IJ SOLN
5.0000 mL | Freq: Once | INTRAMUSCULAR | Status: AC
  Administered 2017-11-09: 16:00:00 via ORAL
  Filled 2017-11-09: qty 5

## 2017-11-09 MED ORDER — LIDOCAINE HCL (PF) 1 % IJ SOLN
10.0000 mL | Freq: Once | INTRAMUSCULAR | Status: AC
  Administered 2017-11-09: 10 mL
  Filled 2017-11-09: qty 10

## 2017-11-09 MED ORDER — LIDOCAINE HCL (PF) 1 % IJ SOLN
INTRAMUSCULAR | Status: AC
  Filled 2017-11-09: qty 10

## 2017-11-09 NOTE — ED Triage Notes (Signed)
Pt reports having swallowed a chicken bone thirty minutes ago and feels as though the bone is still stuck in her throat. Pt able to swallow water without vomiting since the event. Throat is hoarse but pt is able to talk without difficulty. No SOB or increased WOB. Pt unsure if the bone is still stuck but pain persists.

## 2017-11-09 NOTE — ED Notes (Signed)
Patient transported to X-ray 

## 2017-11-09 NOTE — ED Provider Notes (Signed)
[facility] Emergency Department Provider Note  ____________________________________________  Time seen: Approximately 3:42 PM  I have reviewed the triage vital signs and the nursing notes.   HISTORY  Chief Complaint foreign body in throat.   HPI Julia [name] is a 52 y.o. female no significant past medical history who presents for evaluation of sensation of a chicken bone in the back of her throat.  She reports that she was eating her meal 30 minutes prior to arrival when she felt that she swallowed a piece of chicken bone. She feels that is got stuck and still is located in her upper throat.  She was still able to finish her entire meal  with no difficulty.  No vomiting, no respiratory distress, she is managing her saliva with no difficulty.  Her symptoms were sudden in onset.   Allergies Patient has no allergy information on record.  History reviewed. No pertinent family history.  Social History Social History   Tobacco Use  . Smoking status: Former Research scientist (life sciences)  . Smokeless tobacco: Never Used  Substance Use Topics  . Alcohol use: Never    Frequency: Never  . Drug use: Never    Review of Systems  Constitutional: Negative for fever. Eyes: Negative for visual changes. ENT: Negative for sore throat. Neck: No neck pain + foreign body sensation Cardiovascular: Negative for chest pain. Respiratory: Negative for shortness of breath. Gastrointestinal: Negative for abdominal pain, vomiting or diarrhea. Genitourinary: Negative for dysuria. Musculoskeletal: Negative for back pain. Skin: Negative for rash. Neurological: Negative for headaches, weakness or numbness. Psych: No SI or HI  ____________________________________________   PHYSICAL EXAM:  VITAL SIGNS: ED Triage Vitals [11/09/17 1425]  Enc Vitals Group     BP 140/88     Pulse Rate 78     Resp 16     Temp 98.7 F (37.1 C)     Temp Source Oral     SpO2 98 %     Weight 186 lb (84.4  kg)     Height 5\' 4"  (1.626 m)     Head Circumference      Peak Flow      Pain Score 0     Pain Loc      Pain Edu?      Excl. in [place]?     Constitutional: Alert and oriented. Well appearing and in no apparent distress. HEENT:      Head: Normocephalic and atraumatic.         Eyes: Conjunctivae are normal. Sclera is non-icteric.       Mouth/Throat: Mucous membranes are moist.  Oropharynx is clear with no obvious foreign object.      Neck: Supple with no signs of meningismus. Cardiovascular: Regular rate and rhythm. No murmurs, gallops, or rubs. 2+ symmetrical distal pulses are present in all extremities. No JVD. Respiratory: Normal respiratory effort. Lungs are clear to auscultation bilaterally. No wheezes, crackles, or rhonchi.  Gastrointestinal: Soft, non tender, and non distended with positive bowel sounds. No rebound or guarding. Musculoskeletal: Nontender with normal range of motion in all extremities. No edema, cyanosis, or erythema of extremities. Neurologic: Normal speech and language. Face is symmetric. Moving all extremities. No gross focal neurologic deficits are appreciated. Skin: Skin is warm, dry and intact. No rash noted. Psychiatric: Mood and affect are normal. Speech and behavior are normal.  ____________________________________________   LABS (all labs ordered are listed, but only abnormal results are displayed)  Labs Reviewed - No data to display  ____________________________________________  EKG  none  ____________________________________________  RADIOLOGY  I have personally reviewed the images performed during this visit and I agree with the Radiologist's read.   Interpretation by Radiologist:  Dg Neck Soft Tissue  Result Date: 11/09/2017 CLINICAL DATA:  Patient feels like neck bone is stuck in her throat. EXAM: NECK SOFT TISSUES - 1+ VIEW COMPARISON:  None. FINDINGS: There is no evidence of retropharyngeal soft tissue swelling or epiglottic enlargement.  The cervical airway is unremarkable and no radio-opaque foreign body identified. Degenerative joint changes of the lower spine is identified. IMPRESSION: No radiopaque foreign body identified. Electronically Signed   By: Abelardo Diesel M.D.   On: 11/09/2017 15:13     ____________________________________________   PROCEDURES  Procedure(s) performed:yes Fiberoptic laryngoscopy Date/Time: 11/09/2017 3:45 PM Performed by: Rudene Re, MD Authorized by: Rudene Re, MD  Consent: Verbal consent obtained. Risks and benefits: risks, benefits and alternatives were discussed Consent given by: patient Imaging studies: imaging studies available Patient identity confirmed: verbally with patient Time out: Immediately prior to procedure a "time out" was called to verify the correct patient, procedure, equipment, support staff and site/side marked as required. Preparation: Patient was prepped and draped in the usual sterile fashion. Local anesthesia used: yes  Anesthesia: Local anesthesia used: yes Local Anesthetic: lidocaine spray (viscous lidocaine)  Sedation: Patient sedated: no  Patient tolerance: Patient tolerated the procedure well with no immediate complications Comments: Fiberoptic laryngoscopy was performed in the emergency room with no obvious foreign bodies seen, normal epiglottis, normal patent airway.       Critical Care performed:  None ____________________________________________   INITIAL IMPRESSION / ASSESSMENT AND PLAN / ED COURSE   52 y.o. female no significant past medical history who presents for evaluation of sensation of a chicken bone in the back of her throat.  X-ray of the neck was read by radiologist as negative.  When I evaluated the x-ray there is a small rectangular shaped radial opaque body seen below the hyoid bone therefore I consulted Dr. Pryor Ochoa from ENT who looked at the x-ray himself.  He agrees with the radiologist and thinks that this is  just a calcification of the thyroid.  I then performed a fiberoptic laryngoscopic at the bedside which patient tolerated with no difficulty.  I was unable to visualize any foreign bodies, patient had normal epiglottis, normal vocal cords on exam.  Patient be discharged home on Pepcid per Dr. Darien Ramus recommendation and follow-up with him on Monday if she continues to have the sensation of a foreign body.  Discussed return precautions for any difficulty swallowing or breathing.      As part of my medical decision making, I reviewed the following data within the [place] notes reviewed and incorporated, Radiograph reviewed , A consult was requested and obtained from this/these consultant(s) ENT, Notes from prior ED visits and [place] Controlled Substance Database    Pertinent labs & imaging results that were available during my care of the patient were reviewed by me and considered in my medical decision making (see chart for details).    ____________________________________________   FINAL CLINICAL IMPRESSION(S) / ED DIAGNOSES  Final diagnoses:  Foreign body sensation in throat      NEW MEDICATIONS STARTED DURING THIS VISIT:  ED Discharge Orders    None       Note:  This document was prepared using Dragon voice recognition software and may include unintentional dictation errors.    Alfred Levins, [place], MD 11/09/17 [phone]

## 2017-11-09 NOTE — Discharge Instructions (Signed)
If you are still feeling like there is something in your throat by Monday please go see Dr. Pryor Ochoa in his clinic.  If you start having any difficulty swallowing or breathing please return to the emergency room immediately.

## 2017-11-09 NOTE — ED Notes (Signed)
AAOx3.  Skin warm and dry.  NAD.  Voice clear and strong.

## 2018-01-15 DIAGNOSIS — J0100 Acute maxillary sinusitis, unspecified: Secondary | ICD-10-CM | POA: Diagnosis not present

## 2018-02-01 DIAGNOSIS — Z96652 Presence of left artificial knee joint: Secondary | ICD-10-CM | POA: Diagnosis not present

## 2018-02-01 DIAGNOSIS — M1712 Unilateral primary osteoarthritis, left knee: Secondary | ICD-10-CM | POA: Diagnosis not present

## 2018-02-01 DIAGNOSIS — M25461 Effusion, right knee: Secondary | ICD-10-CM | POA: Diagnosis not present

## 2018-02-01 DIAGNOSIS — M1711 Unilateral primary osteoarthritis, right knee: Secondary | ICD-10-CM | POA: Diagnosis not present

## 2018-02-14 DIAGNOSIS — J324 Chronic pansinusitis: Secondary | ICD-10-CM | POA: Diagnosis not present

## 2018-02-14 DIAGNOSIS — J310 Chronic rhinitis: Secondary | ICD-10-CM | POA: Diagnosis not present

## 2018-02-27 DIAGNOSIS — M25551 Pain in right hip: Secondary | ICD-10-CM | POA: Diagnosis not present

## 2018-02-27 DIAGNOSIS — M25561 Pain in right knee: Secondary | ICD-10-CM | POA: Diagnosis not present

## 2018-02-27 DIAGNOSIS — M25562 Pain in left knee: Secondary | ICD-10-CM | POA: Diagnosis not present

## 2018-02-27 DIAGNOSIS — M25552 Pain in left hip: Secondary | ICD-10-CM | POA: Diagnosis not present

## 2018-02-27 DIAGNOSIS — G8929 Other chronic pain: Secondary | ICD-10-CM | POA: Diagnosis not present

## 2018-03-07 DIAGNOSIS — K219 Gastro-esophageal reflux disease without esophagitis: Secondary | ICD-10-CM | POA: Diagnosis not present

## 2018-03-07 DIAGNOSIS — R198 Other specified symptoms and signs involving the digestive system and abdomen: Secondary | ICD-10-CM | POA: Diagnosis not present

## 2018-03-07 DIAGNOSIS — Z86010 Personal history of colonic polyps: Secondary | ICD-10-CM | POA: Diagnosis not present

## 2018-03-07 DIAGNOSIS — R140 Abdominal distension (gaseous): Secondary | ICD-10-CM | POA: Diagnosis not present

## 2018-03-07 DIAGNOSIS — Z860101 Personal history of adenomatous and serrated colon polyps: Secondary | ICD-10-CM | POA: Insufficient documentation

## 2018-03-14 DIAGNOSIS — I10 Essential (primary) hypertension: Secondary | ICD-10-CM | POA: Diagnosis not present

## 2018-03-14 DIAGNOSIS — J321 Chronic frontal sinusitis: Secondary | ICD-10-CM | POA: Diagnosis not present

## 2018-03-14 DIAGNOSIS — J322 Chronic ethmoidal sinusitis: Secondary | ICD-10-CM | POA: Diagnosis not present

## 2018-03-14 DIAGNOSIS — J324 Chronic pansinusitis: Secondary | ICD-10-CM | POA: Diagnosis not present

## 2018-03-14 DIAGNOSIS — E7849 Other hyperlipidemia: Secondary | ICD-10-CM | POA: Diagnosis not present

## 2018-03-14 DIAGNOSIS — E559 Vitamin D deficiency, unspecified: Secondary | ICD-10-CM | POA: Diagnosis not present

## 2018-03-15 DIAGNOSIS — M25561 Pain in right knee: Secondary | ICD-10-CM | POA: Diagnosis not present

## 2018-03-15 DIAGNOSIS — G8929 Other chronic pain: Secondary | ICD-10-CM | POA: Diagnosis not present

## 2018-03-15 DIAGNOSIS — M25552 Pain in left hip: Secondary | ICD-10-CM | POA: Diagnosis not present

## 2018-03-15 DIAGNOSIS — M25551 Pain in right hip: Secondary | ICD-10-CM | POA: Diagnosis not present

## 2018-03-15 DIAGNOSIS — M25562 Pain in left knee: Secondary | ICD-10-CM | POA: Diagnosis not present

## 2018-03-19 DIAGNOSIS — K219 Gastro-esophageal reflux disease without esophagitis: Secondary | ICD-10-CM | POA: Diagnosis not present

## 2018-03-19 DIAGNOSIS — Z23 Encounter for immunization: Secondary | ICD-10-CM | POA: Diagnosis not present

## 2018-03-19 DIAGNOSIS — R748 Abnormal levels of other serum enzymes: Secondary | ICD-10-CM | POA: Diagnosis not present

## 2018-03-19 DIAGNOSIS — I10 Essential (primary) hypertension: Secondary | ICD-10-CM | POA: Diagnosis not present

## 2018-03-19 DIAGNOSIS — N3281 Overactive bladder: Secondary | ICD-10-CM | POA: Diagnosis not present

## 2018-04-03 DIAGNOSIS — Z1211 Encounter for screening for malignant neoplasm of colon: Secondary | ICD-10-CM | POA: Diagnosis not present

## 2018-04-03 DIAGNOSIS — K640 First degree hemorrhoids: Secondary | ICD-10-CM | POA: Diagnosis not present

## 2018-04-03 DIAGNOSIS — Z86010 Personal history of colonic polyps: Secondary | ICD-10-CM | POA: Diagnosis not present

## 2018-04-03 DIAGNOSIS — K621 Rectal polyp: Secondary | ICD-10-CM | POA: Diagnosis not present

## 2018-04-03 DIAGNOSIS — D128 Benign neoplasm of rectum: Secondary | ICD-10-CM | POA: Diagnosis not present

## 2018-05-03 DIAGNOSIS — R509 Fever, unspecified: Secondary | ICD-10-CM | POA: Diagnosis not present

## 2018-05-03 DIAGNOSIS — J019 Acute sinusitis, unspecified: Secondary | ICD-10-CM | POA: Diagnosis not present

## 2018-05-14 DIAGNOSIS — J069 Acute upper respiratory infection, unspecified: Secondary | ICD-10-CM | POA: Diagnosis not present

## 2018-05-14 DIAGNOSIS — R05 Cough: Secondary | ICD-10-CM | POA: Diagnosis not present

## 2018-06-21 ENCOUNTER — Ambulatory Visit: Payer: Self-pay | Admitting: Mental Health

## 2018-06-25 ENCOUNTER — Ambulatory Visit: Payer: Self-pay | Admitting: Mental Health

## 2018-07-03 DIAGNOSIS — R740 Nonspecific elevation of levels of transaminase and lactic acid dehydrogenase [LDH]: Secondary | ICD-10-CM | POA: Diagnosis not present

## 2018-07-03 DIAGNOSIS — K219 Gastro-esophageal reflux disease without esophagitis: Secondary | ICD-10-CM | POA: Diagnosis not present

## 2018-07-03 DIAGNOSIS — R1319 Other dysphagia: Secondary | ICD-10-CM | POA: Diagnosis not present

## 2018-07-03 DIAGNOSIS — K760 Fatty (change of) liver, not elsewhere classified: Secondary | ICD-10-CM | POA: Diagnosis not present

## 2018-07-03 DIAGNOSIS — I10 Essential (primary) hypertension: Secondary | ICD-10-CM | POA: Diagnosis not present

## 2018-07-03 DIAGNOSIS — M542 Cervicalgia: Secondary | ICD-10-CM | POA: Diagnosis not present

## 2018-07-05 DIAGNOSIS — R945 Abnormal results of liver function studies: Secondary | ICD-10-CM | POA: Diagnosis not present

## 2018-07-05 DIAGNOSIS — R740 Nonspecific elevation of levels of transaminase and lactic acid dehydrogenase [LDH]: Secondary | ICD-10-CM | POA: Diagnosis not present

## 2018-07-05 DIAGNOSIS — K760 Fatty (change of) liver, not elsewhere classified: Secondary | ICD-10-CM | POA: Diagnosis not present

## 2018-07-06 ENCOUNTER — Other Ambulatory Visit: Payer: Self-pay | Admitting: Nurse Practitioner

## 2018-07-06 DIAGNOSIS — R740 Nonspecific elevation of levels of transaminase and lactic acid dehydrogenase [LDH]: Secondary | ICD-10-CM

## 2018-07-06 DIAGNOSIS — R7401 Elevation of levels of liver transaminase levels: Secondary | ICD-10-CM

## 2018-07-06 DIAGNOSIS — K760 Fatty (change of) liver, not elsewhere classified: Secondary | ICD-10-CM

## 2018-07-09 ENCOUNTER — Ambulatory Visit: Payer: BLUE CROSS/BLUE SHIELD | Admitting: Mental Health

## 2018-07-09 ENCOUNTER — Other Ambulatory Visit: Payer: Self-pay

## 2018-07-09 DIAGNOSIS — F331 Major depressive disorder, recurrent, moderate: Secondary | ICD-10-CM

## 2018-07-09 NOTE — Progress Notes (Signed)
[facility] Counselor/Therapist Progress Note  Patient ID: Julia [name], MRN: [id number],    Date: 07/09/2018  Time Spent: 44minutes  Treatment Type: Individual Therapy  Reported Symptoms:   Mental Status Exam:  Appearance:   Casual     Behavior:  Appropriate, Sharing and Blaming  Motor:  Normal  Speech/Language:   Pressured  Affect:  Depressed  Mood:  anxious and depressed  Thought process:  normal  Thought content:    WNL  Sensory/Perceptual disturbances:    WNL  Orientation:  oriented to person, place and time/date  Attention:  Good  Concentration:  Good  Memory:  WNL  Fund of knowledge:   Good  Insight:    Good  Judgment:   Good  Impulse Control:  Good   Risk Assessment: Danger to Self:  No Self-injurious Behavior: No Danger to Others: No Duty to Warn:no Physical Aggression / Violence      No          Interventions: Cognitive Behavioral Therapy  Diagnosis:     F33.1  Plan:    Treatment Plan   Client Name:   Date:    Problems:          Anxiety:                   Locus of control                                Work/Life balance     X      Depression                             Problem-solving                       X       Relationships                                   Boundaries                             X        Coping srategies                             Communication             X      Recovery from trauma                   Self-care                                        Validation  Other     Goals:  Patient  1. Maintains mood stabiity:  decreased symptoms of     depression     anxiety  2.   Practices pro-active self-care:   restful sleep, nutrition, exercise  3.   Effective utilizes boundaries and sets limits  4.   Utliizes coping strategies and problem solving techniques for stress management  5.   Feels accurately heard, understood and validated  Other____Resolving regrets from childhood.  ____________________________________________________________________________  _____________________________________________________________________________________     Discussed treatment plan with Patient.  Electronically signed.     [place], [facility]

## 2018-07-10 DIAGNOSIS — Z01419 Encounter for gynecological examination (general) (routine) without abnormal findings: Secondary | ICD-10-CM | POA: Diagnosis not present

## 2018-07-10 DIAGNOSIS — Z6832 Body mass index (BMI) 32.0-32.9, adult: Secondary | ICD-10-CM | POA: Diagnosis not present

## 2018-07-10 DIAGNOSIS — Z124 Encounter for screening for malignant neoplasm of cervix: Secondary | ICD-10-CM | POA: Diagnosis not present

## 2018-07-10 DIAGNOSIS — Z1151 Encounter for screening for human papillomavirus (HPV): Secondary | ICD-10-CM | POA: Diagnosis not present

## 2018-07-10 DIAGNOSIS — Z113 Encounter for screening for infections with a predominantly sexual mode of transmission: Secondary | ICD-10-CM | POA: Diagnosis not present

## 2018-07-12 ENCOUNTER — Ambulatory Visit
Admission: RE | Admit: 2018-07-12 | Discharge: 2018-07-12 | Disposition: A | Discharge status: Home / Self Care | Payer: BLUE CROSS/BLUE SHIELD | Source: Ambulatory Visit | Attending: Nurse Practitioner | Admitting: Nurse Practitioner

## 2018-07-12 ENCOUNTER — Other Ambulatory Visit: Payer: Self-pay

## 2018-07-12 DIAGNOSIS — R740 Nonspecific elevation of levels of transaminase and lactic acid dehydrogenase [LDH]: Secondary | ICD-10-CM | POA: Diagnosis not present

## 2018-07-12 DIAGNOSIS — K760 Fatty (change of) liver, not elsewhere classified: Secondary | ICD-10-CM | POA: Diagnosis not present

## 2018-07-12 DIAGNOSIS — R7401 Elevation of levels of liver transaminase levels: Secondary | ICD-10-CM

## 2018-07-23 ENCOUNTER — Ambulatory Visit: Payer: BLUE CROSS/BLUE SHIELD | Admitting: Mental Health

## 2018-07-23 DIAGNOSIS — F331 Major depressive disorder, recurrent, moderate: Secondary | ICD-10-CM

## 2018-07-31 DIAGNOSIS — K210 Gastro-esophageal reflux disease with esophagitis: Secondary | ICD-10-CM | POA: Diagnosis not present

## 2018-07-31 DIAGNOSIS — K314 Gastric diverticulum: Secondary | ICD-10-CM | POA: Diagnosis not present

## 2018-07-31 DIAGNOSIS — K2970 Gastritis, unspecified, without bleeding: Secondary | ICD-10-CM | POA: Diagnosis not present

## 2018-07-31 DIAGNOSIS — K3189 Other diseases of stomach and duodenum: Secondary | ICD-10-CM | POA: Diagnosis not present

## 2018-07-31 DIAGNOSIS — R1310 Dysphagia, unspecified: Secondary | ICD-10-CM | POA: Diagnosis not present

## 2018-07-31 DIAGNOSIS — K2900 Acute gastritis without bleeding: Secondary | ICD-10-CM | POA: Diagnosis not present

## 2018-07-31 DIAGNOSIS — K2950 Unspecified chronic gastritis without bleeding: Secondary | ICD-10-CM | POA: Diagnosis not present

## 2018-08-02 DIAGNOSIS — M25471 Effusion, right ankle: Secondary | ICD-10-CM | POA: Diagnosis not present

## 2018-08-02 DIAGNOSIS — S86891A Other injury of other muscle(s) and tendon(s) at lower leg level, right leg, initial encounter: Secondary | ICD-10-CM | POA: Diagnosis not present

## 2018-08-02 DIAGNOSIS — M25571 Pain in right ankle and joints of right foot: Secondary | ICD-10-CM | POA: Diagnosis not present

## 2018-08-24 ENCOUNTER — Encounter: Payer: Self-pay | Admitting: Mental Health

## 2018-08-24 ENCOUNTER — Ambulatory Visit (INDEPENDENT_AMBULATORY_CARE_PROVIDER_SITE_OTHER): Payer: BLUE CROSS/BLUE SHIELD | Admitting: Mental Health

## 2018-08-24 ENCOUNTER — Other Ambulatory Visit: Payer: Self-pay

## 2018-08-24 DIAGNOSIS — F331 Major depressive disorder, recurrent, moderate: Secondary | ICD-10-CM

## 2018-08-24 NOTE — Progress Notes (Signed)
[facility] Counselor/Therapist Progress Note  Patient ID: Julia [name], MRN: [id number],    I connected with patient by a video enabled telemedicine application or telephone, with their informed consent, and verified patient privacy and that I am speaking with the correct person using two identifiers.  I was located at home and patient at home. Corona Virus Pandemic. 11:00 AM.   Date: 08/24/2018  Time Spent: 45 minutes   Treatment Type: Individual Therapy  Reported Symptoms:  Depressed, anxious, feeling shut down. No motivation. Medication not working well currently. Wants to run away, escape.  Mental Status Exam:  Appearance:   Unseen on telephone  Behavior:  Agitated  Motor:  Restlestness  Speech/Language:   Pressured  Affect:  Negative, Labile and Tearful  Mood:  anxious, depressed, irritable and sad  Thought process:  normal  Thought content:    WNL  Sensory/Perceptual disturbances:    WNL  Orientation:  oriented to person, place and time/date  Attention:  Good  Concentration:  Good  Memory:  WNL  Fund of knowledge:   Good  Insight:    Good  Judgment:   Good  Impulse Control:  Good   Risk Assessment: Danger to Self:  No Self-injurious Behavior: No Danger to Others: No Duty to Warn:no Physical Aggression / Violence:No  Access to Firearms a concern: No  Gang Involvement:No   Subjective:  After trip to [place] and meeting her sister and having a very liberating new sense of self and her history, feels cramped by family roles and expectations when she returns to life as usual. Has no motivation to do anything. Just wants to escape. Depressed, moody.  Interventions: Solution-Oriented/Positive Psychology, insight oriented, supportive  Diagnosis:   ICD-10-CM   1. Major depressive disorder, recurrent episode, moderate [facility]) F33.1       Treatment Plan   Patient Name: Julia [name]   Date: Aug 24, 2018   Didactic topic to be discussed:    Anxiety:                   Locus of control                              Work/Life balance           Depression                             Problem-solving                              Relationships                                   Boundaries                                     Coping srategies                             Communication                    Recovery from trauma  Self-care                                     Validation  Other: Self- awareness and fulfillment     Goals:  Patient  1. Maintains mood stabiity:  decreased symptoms of     depression     anxiety  2.   Practices pro-active self-care:   restful sleep, nutrition, exercise, socialization  3.   Effective utilizes boundaries and sets limits  4.   Utliizes coping strategies and problem solving techniques for stress management  5.   Feels accurately heard, understood and validated  Other : Family of origen issues resolved, new sense of self      [place], [facility]

## 2018-09-06 ENCOUNTER — Encounter: Payer: Self-pay | Admitting: Mental Health

## 2018-09-06 ENCOUNTER — Other Ambulatory Visit: Payer: Self-pay

## 2018-09-06 ENCOUNTER — Ambulatory Visit (INDEPENDENT_AMBULATORY_CARE_PROVIDER_SITE_OTHER): Payer: BLUE CROSS/BLUE SHIELD | Admitting: Mental Health

## 2018-09-06 DIAGNOSIS — F331 Major depressive disorder, recurrent, moderate: Secondary | ICD-10-CM | POA: Diagnosis not present

## 2018-09-06 NOTE — Progress Notes (Signed)
[facility] Counselor/Therapist Progress Note  Patient ID: Julia [name], MRN: [id number],    Date: 09/06/2018   Telehealth visit I connected with patient by a video enabled telemedicine/telehealth application or telephone, with her informed consent, and verified patient privacy and that I am speaking with the correct person using two identifiers.  I was located at my home and patient at her outdoors.  We discussed the limitations, risks, and security and privacy concerns associated with telehealth services and the availability of in-person appointments, including awareness that she may be responsible for charges related to the service, and she expressed understanding and agreed to proceed.  I discussed treatment planning with her, with opportunity to ask and answer all questions. Agreed with the plan, demonstrated an understanding of the instructions, and made her aware to call our office if symptoms worsen or she feels she is in a crisis state and needs immediate contact.  Corona Virus Pandemic.  1:00 PM  Time Spent: 50 minutes   Treatment Type: Individual Therapy  Reported Symptoms: Feels much better. Has been using vitamins, supplements and essential oils. Also trying to exercise. Much more upbeat and positive. Not taking long naps now.  Mental Status Exam:  Appearance:   unseen- telephone     Behavior:  Appropriate and Motivated  Motor:  Normal  Speech/Language:   Clear and Coherent  Affect:  Appropriate and upbeat, hopeful  Mood:  euthymic  Thought process:  normal  Thought content:    WNL  Sensory/Perceptual disturbances:    WNL  Orientation:  oriented to person, place and time/date  Attention:  Good  Concentration:  Good  Memory:  WNL  Fund of knowledge:   Good  Insight:    Good  Judgment:   Good  Impulse Control:  Good   Risk Assessment: Danger to Self:  No Self-injurious Behavior: No Danger to Others: No Duty to Warn:no Physical Aggression / Violence:No   Access to Firearms a concern: No  Gang Involvement:No   Subjective: Enjoying fellowship with daughters and 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 Mood and tone of voice much happier. Feels the addition of vitamins and supplements and essential oils has made a difference.  Interventions: Solution-Oriented/Positive Psychology, Insight-Oriented and Interpersonal  Diagnosis:   ICD-10-CM   1. Major depressive disorder, recurrent episode, moderate ([employer]) F33.1       Treatment Plan   Patient Name:   Date:   Didactic topic to be discussed:           Anxiety:                   Locus of control                              Work/Life balance           Depression                             Problem-solving                              Relationships  Boundaries                                     Coping srategies                             Communication                    Recovery from trauma                    Self-care                                     Validation  Other     Goals:  Patient  1. Maintains mood stabiity:  decreased symptoms of     depression     anxiety  2.   Practices pro-active self-care:   restful sleep, nutrition, exercise, socialization  3.   Effective utilizes boundaries and sets limits  4.   Utliizes coping strategies and problem solving techniques for stress management  5.   Feels accurately heard, understood and validated  Other: Increases marital satisfaction      [place], [facility]

## 2018-09-18 DIAGNOSIS — I10 Essential (primary) hypertension: Secondary | ICD-10-CM | POA: Diagnosis not present

## 2018-09-18 DIAGNOSIS — E559 Vitamin D deficiency, unspecified: Secondary | ICD-10-CM | POA: Diagnosis not present

## 2018-09-18 DIAGNOSIS — R739 Hyperglycemia, unspecified: Secondary | ICD-10-CM | POA: Diagnosis not present

## 2018-09-19 DIAGNOSIS — K219 Gastro-esophageal reflux disease without esophagitis: Secondary | ICD-10-CM | POA: Diagnosis not present

## 2018-09-19 DIAGNOSIS — E559 Vitamin D deficiency, unspecified: Secondary | ICD-10-CM | POA: Diagnosis not present

## 2018-09-19 DIAGNOSIS — I10 Essential (primary) hypertension: Secondary | ICD-10-CM | POA: Diagnosis not present

## 2018-09-19 DIAGNOSIS — Z0000 Encounter for general adult medical examination without abnormal findings: Secondary | ICD-10-CM | POA: Diagnosis not present

## 2018-09-25 ENCOUNTER — Encounter: Payer: Self-pay | Admitting: Psychiatry

## 2018-09-25 ENCOUNTER — Ambulatory Visit: Payer: BC Managed Care – PPO | Admitting: Psychiatry

## 2018-09-25 ENCOUNTER — Other Ambulatory Visit: Payer: Self-pay

## 2018-09-25 DIAGNOSIS — F331 Major depressive disorder, recurrent, moderate: Secondary | ICD-10-CM

## 2018-09-25 DIAGNOSIS — F5105 Insomnia due to other mental disorder: Secondary | ICD-10-CM

## 2018-09-25 MED ORDER — TRAZODONE HCL 100 MG PO TABS: 100.0000 mg | ORAL_TABLET | Freq: Every day | ORAL | 1 refills | Status: DC

## 2018-09-25 MED ORDER — DULOXETINE HCL 60 MG PO CPEP: 60.0000 mg | ORAL_CAPSULE | Freq: Two times a day (BID) | ORAL | 0 refills | Status: DC

## 2018-09-25 MED ORDER — ARIPIPRAZOLE 5 MG PO TABS: ORAL_TABLET | ORAL | 1 refills | Status: DC

## 2018-09-25 NOTE — Progress Notes (Signed)
Julia [name] [id number] 01-Mar-1966 53 y.o.  Subjective:   Patient ID:  Julia [name] is a 53 y.o. (DOB Sep 16, 1965) female.  Chief Complaint:  Chief Complaint  Patient presents with  . Follow-up    Medication mangement    HPI Julia [name] presents to the office today for follow-up of major depression and dysthymia.  Last seen May 2019.  She was on duloxetine 60 mg twice daily and trazodone 100 mg nightly.  No meds were changed.  Huge highs and some very low lows.  Had a great visit with newfound adopted sister, Julia [name] and then missed her.   Felt it changed her life in good way but then returned and has had trouble adjusting.  Was doing fair before that but not great.  Denies mania.  Had residual depression before meeting Julia [name].  Often struggles with some degree of irritability but mainly just with her family.  For a month no motivation and had to push herself but it was all after she returned from visiting adopted sister, Julia [name].  Just met her.    Patient denies any recent difficulty with anxiety.  Patient denies difficulty with sleep initiation or maintenance with trazodone. Denies appetite disturbance.  Patient reports that energy and motivation are not great.  Patient denies any difficulty with concentration.  Patient denies any suicidal ideation.  Past Psychiatric Medication Trials: Duloxetine 120, venlafaxine, Wellbutrin irritable, sertraline, citalopram, lithium, Ambien side effects, trazodone, hydroxyzine,, alprazolam, Sonata  Review of Systems:  Review of Systems  Neurological: Negative for tremors and weakness.    Medications: I have reviewed the patient's current medications.  Current Outpatient Medications  Medication Sig Dispense Refill  . DULoxetine (CYMBALTA) 60 MG capsule Take 1 capsule (60 mg total) by mouth 2 (two) times daily. 180 capsule 0  . fluticasone (FLONASE) 50 MCG/ACT nasal spray Place into the nose.    . Multiple Vitamins-Minerals (MULTIVITAMIN  ADULTS PO) Take by mouth.    . pantoprazole (PROTONIX) 40 MG tablet 40 mg.    . traZODone (DESYREL) 100 MG tablet Take 1 tablet (100 mg total) by mouth at bedtime. 90 tablet 1  . ARIPiprazole (ABILIFY) 5 MG tablet 1/2 daily for 1 week then 1 daily 30 tablet 1   No current facility-administered medications for this visit.     Medication Side Effects: Other: sweating resolved  Allergies:  Allergies  Allergen Reactions  . Citalopram Other (See Comments)    Weight gain Weight gain   . Olanzapine Other (See Comments)    Weight gain Weight gain   . Penicillin G Other (See Comments)    other  . Telithromycin Diarrhea, Nausea Only and Nausea And Vomiting  . Penicillin V Potassium Rash    Tolerated Ancef 01/24/17     History reviewed. No pertinent past medical history.  History reviewed. No pertinent family history.  Social History   Socioeconomic History  . Marital status: Married    Spouse name: Not on file  . Number of children: Not on file  . Years of education: Not on file  . Highest education level: Not on file  Occupational History  . Not on file  Social Needs  . Financial resource strain: Not on file  . Food insecurity:    Worry: Not on file    Inability: Not on file  . Transportation needs:    Medical: Not on file    Non-medical: Not on file  Tobacco Use  . Smoking status: Former Research scientist (life sciences)  .  Smokeless tobacco: Never Used  Substance and Sexual Activity  . Alcohol use: Never    Frequency: Never  . Drug use: Never  . Sexual activity: Yes  Lifestyle  . Physical activity:    Days per week: Not on file    Minutes per session: Not on file  . Stress: Not on file  Relationships  . Social connections:    Talks on phone: Not on file    Gets together: Not on file    Attends religious service: Not on file    Active member of club or organization: Not on file    Attends meetings of clubs or organizations: Not on file    Relationship status: Not on file  .  Intimate partner violence:    Fear of current or ex partner: Not on file    Emotionally abused: Not on file    Physically abused: Not on file    Forced sexual activity: Not on file  Other Topics Concern  . Not on file  Social History Narrative  . Not on file    Past Medical History, Surgical history, Social history, and Family history were reviewed and updated as appropriate.   Please see review of systems for further details on the patient's review from today.   Objective:   Physical Exam:  There were no vitals taken for this visit.  Physical Exam Neurological:     Mental Status: She is alert and oriented to person, place, and time.     Cranial Nerves: No dysarthria.  Psychiatric:        Attention and Perception: Attention normal.        Mood and Affect: Mood is depressed. Mood is not anxious.        Speech: Speech normal.        Behavior: Behavior is cooperative.        Thought Content: Thought content normal. Thought content is not paranoid or delusional. Thought content does not include homicidal or suicidal ideation. Thought content does not include homicidal or suicidal plan.        Cognition and Memory: Cognition and memory normal.        Judgment: Judgment normal.     Comments: Some irritability not severe.  Not present in the office     Lab Review:  No results found for: NA, K, CL, CO2, GLUCOSE, BUN, CREATININE, CALCIUM, PROT, ALBUMIN, AST, ALT, ALKPHOS, BILITOT, GFRNONAA, GFRAA     Component Value Date/Time   WBC 8.6 06/03/2010 1056   RBC 4.77 06/03/2010 1056   HGB 14.8 06/03/2010 1056   HCT 44.0 06/03/2010 1056   PLT 319 06/03/2010 1056   MCV 92.2 06/03/2010 1056   MCH 31.0 06/03/2010 1056   MCHC 33.6 06/03/2010 1056   RDW 12.7 06/03/2010 1056    No results found for: POCLITH, LITHIUM   No results found for: PHENYTOIN, PHENOBARB, VALPROATE, CBMZ   .res Assessment: Plan:    Major depressive disorder, recurrent episode, moderate ([place]) - Plan:  ARIPiprazole (ABILIFY) 5 MG tablet, DULoxetine (CYMBALTA) 60 MG capsule  Insomnia due to mental condition - Plan: traZODone (DESYREL) 100 MG tablet   As noted Julia [name] has been on several different antidepressants over the years including SSS RI's and SNRIs.  She is taken lithium.  She has residual depression and would like help with that.  She has never been on a partial dopamine agonist so we will start Abilify 2.5 mg daily and if needed 5 mg daily.  Discussed  side effects in detail.  Discussed potential metabolic side effects associated with atypical antipsychotics, as well as potential risk for movement side effects. Advised pt to contact office if movement side effects occur.   If she has a very good response we could consider reduction and the duloxetine.  Sleep is well controlled with the trazodone  30-minute appointment  Follow-up 8 weeks  Lynder Parents MD, DFAPA Please see After Visit Summary for patient specific instructions.  Future Appointments  Date Time Provider [place]  11/26/2018  1:00 PM Cottle, Billey Co., MD [facility] None    No orders of the defined types were placed in this encounter.     -------------------------------

## 2018-10-15 DIAGNOSIS — M542 Cervicalgia: Secondary | ICD-10-CM | POA: Diagnosis not present

## 2018-10-16 ENCOUNTER — Other Ambulatory Visit: Payer: Self-pay

## 2018-10-16 ENCOUNTER — Encounter: Payer: Self-pay | Admitting: Mental Health

## 2018-10-16 ENCOUNTER — Ambulatory Visit: Payer: BC Managed Care – PPO | Admitting: Mental Health

## 2018-10-16 DIAGNOSIS — F331 Major depressive disorder, recurrent, moderate: Secondary | ICD-10-CM

## 2018-10-16 NOTE — Progress Notes (Signed)
[facility] Counselor/Therapist Progress Note  Patient ID: Julia [name], MRN: [id number],    Date: 10/16/2018  Time Spent: 55 minutes  Treatment Type: Individual Therapy  Reported Symptoms: High irritability factor, impatient. Denies depression, though occasionally has tearfulness.   Mental Status Exam:  Appearance:   Casual     Behavior:  Appropriate and Sharing  Motor:  Normal  Speech/Language:   Clear and Coherent  Affect:  Appropriate and Tearful  Mood:  irritable  Thought process:  goal directed  Thought content:    WNL  Sensory/Perceptual disturbances:    WNL  Orientation:  oriented to person, place and time/date  Attention:  Good  Concentration:  Good  Memory:  WNL  Fund of knowledge:   Good  Insight:    Good  Judgment:   Good  Impulse Control:  Good   Risk Assessment: Danger to Self:  No Self-injurious Behavior: No Danger to Others: No Duty to Warn:no Physical Aggression / Violence:No  Access to Firearms a concern: No Gang Involvement:No   Subjective:  Occasionally she and husband have short fuse with each other. But realizes that she has a good life and has things to be thankful for. Happy with choices daughters have made regarding men in their lives and treatment of grandchildren. Able to enjoy and has positive anticipations. Has chronic pain in her neck and knees. Finds that she fidgits a lot when trying to sit still. Has MRI and has appointment with neurosurgeon afterward next week. Waking up early in the morning and enjoying nature.  Interventions: Solution-Oriented/Positive Psychology, Insight-Oriented, Family Systems, Interpersonal and supportive  Diagnosis: Major Depression    Treatment Plan   Patient Name: Julia [name]   Date: October 16, 2018   Didactic topic to be discussed:           Anxiety:                   Locus of control                              Work/Life balance           Depression                              Problem-solving                              Relationships                                   Boundaries                                     Coping srategies                             Communication                    Recovery from trauma                    Self-care  Validation  Other     Goals:  Patient  1. Maintains mood stabiity:  decreased symptoms of     depression     anxiety  2.   Practices pro-active self-care:   restful sleep, nutrition, exercise, socialization  3.   Effective utilizes boundaries and sets limits  4.   Utliizes coping strategies and problem solving techniques for stress management  5.   Feels accurately heard, understood and validated  Other      Logan Bores [place], [facility]

## 2018-10-22 DIAGNOSIS — K259 Gastric ulcer, unspecified as acute or chronic, without hemorrhage or perforation: Secondary | ICD-10-CM | POA: Insufficient documentation

## 2018-10-22 DIAGNOSIS — K2270 Barrett's esophagus without dysplasia: Secondary | ICD-10-CM | POA: Insufficient documentation

## 2018-10-22 DIAGNOSIS — R748 Abnormal levels of other serum enzymes: Secondary | ICD-10-CM | POA: Diagnosis not present

## 2018-10-22 DIAGNOSIS — K210 Gastro-esophageal reflux disease with esophagitis: Secondary | ICD-10-CM | POA: Diagnosis not present

## 2018-10-22 DIAGNOSIS — Z23 Encounter for immunization: Secondary | ICD-10-CM | POA: Diagnosis not present

## 2018-10-22 DIAGNOSIS — K760 Fatty (change of) liver, not elsewhere classified: Secondary | ICD-10-CM | POA: Diagnosis not present

## 2018-10-22 DIAGNOSIS — K253 Acute gastric ulcer without hemorrhage or perforation: Secondary | ICD-10-CM | POA: Diagnosis not present

## 2018-10-23 DIAGNOSIS — M4722 Other spondylosis with radiculopathy, cervical region: Secondary | ICD-10-CM | POA: Diagnosis not present

## 2018-10-23 DIAGNOSIS — M542 Cervicalgia: Secondary | ICD-10-CM | POA: Diagnosis not present

## 2018-10-23 DIAGNOSIS — M4312 Spondylolisthesis, cervical region: Secondary | ICD-10-CM | POA: Diagnosis not present

## 2018-10-23 DIAGNOSIS — M5412 Radiculopathy, cervical region: Secondary | ICD-10-CM | POA: Diagnosis not present

## 2018-10-23 DIAGNOSIS — M4310 Spondylolisthesis, site unspecified: Secondary | ICD-10-CM | POA: Diagnosis not present

## 2018-10-23 DIAGNOSIS — M5030 Other cervical disc degeneration, unspecified cervical region: Secondary | ICD-10-CM | POA: Diagnosis not present

## 2018-10-23 DIAGNOSIS — M5010 Cervical disc disorder with radiculopathy, unspecified cervical region: Secondary | ICD-10-CM | POA: Diagnosis not present

## 2018-10-23 DIAGNOSIS — M4690 Unspecified inflammatory spondylopathy, site unspecified: Secondary | ICD-10-CM | POA: Diagnosis not present

## 2018-10-25 DIAGNOSIS — Z1231 Encounter for screening mammogram for malignant neoplasm of breast: Secondary | ICD-10-CM | POA: Diagnosis not present

## 2018-11-01 DIAGNOSIS — M4722 Other spondylosis with radiculopathy, cervical region: Secondary | ICD-10-CM | POA: Diagnosis not present

## 2018-11-01 DIAGNOSIS — M5030 Other cervical disc degeneration, unspecified cervical region: Secondary | ICD-10-CM | POA: Insufficient documentation

## 2018-11-13 DIAGNOSIS — M47812 Spondylosis without myelopathy or radiculopathy, cervical region: Secondary | ICD-10-CM | POA: Diagnosis not present

## 2018-11-20 DIAGNOSIS — M1711 Unilateral primary osteoarthritis, right knee: Secondary | ICD-10-CM | POA: Diagnosis not present

## 2018-11-20 DIAGNOSIS — M170 Bilateral primary osteoarthritis of knee: Secondary | ICD-10-CM | POA: Diagnosis not present

## 2018-11-20 DIAGNOSIS — Z96652 Presence of left artificial knee joint: Secondary | ICD-10-CM | POA: Diagnosis not present

## 2018-11-20 DIAGNOSIS — M2550 Pain in unspecified joint: Secondary | ICD-10-CM | POA: Diagnosis not present

## 2018-11-21 DIAGNOSIS — Z23 Encounter for immunization: Secondary | ICD-10-CM | POA: Diagnosis not present

## 2018-11-26 ENCOUNTER — Other Ambulatory Visit: Payer: Self-pay

## 2018-11-26 ENCOUNTER — Ambulatory Visit (INDEPENDENT_AMBULATORY_CARE_PROVIDER_SITE_OTHER): Payer: BC Managed Care – PPO | Admitting: Psychiatry

## 2018-11-26 ENCOUNTER — Encounter: Payer: Self-pay | Admitting: Psychiatry

## 2018-11-26 DIAGNOSIS — F5105 Insomnia due to other mental disorder: Secondary | ICD-10-CM

## 2018-11-26 DIAGNOSIS — F331 Major depressive disorder, recurrent, moderate: Secondary | ICD-10-CM

## 2018-11-26 MED ORDER — ARIPIPRAZOLE 5 MG PO TABS: 5.0000 mg | ORAL_TABLET | Freq: Every day | ORAL | 0 refills | Status: DC

## 2018-11-26 MED ORDER — DULOXETINE HCL 60 MG PO CPEP: 60.0000 mg | ORAL_CAPSULE | Freq: Two times a day (BID) | ORAL | 1 refills | Status: DC

## 2018-11-26 MED ORDER — TRAZODONE HCL 100 MG PO TABS: 100.0000 mg | ORAL_TABLET | Freq: Every day | ORAL | 1 refills | Status: DC

## 2018-11-26 NOTE — Progress Notes (Signed)
Julia [name] [id number] 25-May-1965 53 y.o.  Subjective:   Patient ID:  Julia [name] is a 53 y.o. (DOB Sep 28, 1965) female.  Chief Complaint:  Chief Complaint  Patient presents with  . Follow-up    Medication Management    HPI Julia [name] presents to the office today for follow-up of major depression and dysthymia.  At her last visit September 25, 2018 she had had more mood swings and depression.  She was on duloxetine 60 mg twice daily and trazodone 100 mg nightly.  She was started on Abilify as an augmentation agent.  Fantastic.  Better energy and motivation and mood.  Resolution of death thoughts and irritability.  Very stable at just the 2.5 mg Abilify.  No depression since.  Anxiety managed.  No SE except initially a little mindless eating resolve.  H has noticed and complemented.  Sleep is good.  Initially EMA and overall easier to get OOB in the morning.  At least 7 hours of sleep.  Huge highs and some very low lows.  Had a great visit with newfound adopted sister, Julia [name] and then missed her.   Felt it changed her life in good way but then returned and has had trouble adjusting.  Was doing fair before that but not great.  Denies mania.  Had residual depression before meeting Julia [name].  Often struggles with some degree of irritability but mainly just with her family.  Patient denies any recent difficulty with anxiety.  Patient denies difficulty with sleep initiation or maintenance with trazodone. Denies appetite disturbance.  Patient reports that energy and motivation are not great.  Patient denies any difficulty with concentration.  Patient denies any suicidal ideation.  Past Psychiatric Medication Trials: Duloxetine 120, venlafaxine, Wellbutrin irritable, sertraline, citalopram, lithium, Ambien side effects, trazodone, hydroxyzine,, alprazolam, Sonata  Review of Systems:  Review of Systems  Neurological: Negative for tremors and weakness.    Medications: I have reviewed the  patient's current medications.  Current Outpatient Medications  Medication Sig Dispense Refill  . ARIPiprazole (ABILIFY) 5 MG tablet 1/2 daily for 1 week then 1 daily 30 tablet 1  . DULoxetine (CYMBALTA) 60 MG capsule Take 1 capsule (60 mg total) by mouth 2 (two) times daily. 180 capsule 0  . fluticasone (FLONASE) 50 MCG/ACT nasal spray Place into the nose.    . Multiple Vitamins-Minerals (MULTIVITAMIN ADULTS PO) Take by mouth.    . pantoprazole (PROTONIX) 40 MG tablet 40 mg.    . traZODone (DESYREL) 100 MG tablet Take 1 tablet (100 mg total) by mouth at bedtime. 90 tablet 1   No current facility-administered medications for this visit.     Medication Side Effects: Other: sweating resolved  Allergies:  Allergies  Allergen Reactions  . Citalopram Other (See Comments)    Weight gain Weight gain   . Olanzapine Other (See Comments)    Weight gain Weight gain   . Penicillin G Other (See Comments)    other  . Telithromycin Diarrhea, Nausea Only and Nausea And Vomiting  . Penicillin V Potassium Rash    Tolerated Ancef 01/24/17     History reviewed. No pertinent past medical history.  History reviewed. No pertinent family history.  Social History   Socioeconomic History  . Marital status: Married    Spouse name: Not on file  . Number of children: Not on file  . Years of education: Not on file  . Highest education level: Not on file  Occupational History  . Not on file  Social Needs  . Financial resource strain: Not on file  . Food insecurity    Worry: Not on file    Inability: Not on file  . Transportation needs    Medical: Not on file    Non-medical: Not on file  Tobacco Use  . Smoking status: Former Research scientist (life sciences)  . Smokeless tobacco: Never Used  Substance and Sexual Activity  . Alcohol use: Never    Frequency: Never  . Drug use: Never  . Sexual activity: Yes  Lifestyle  . Physical activity    Days per week: Not on file    Minutes per session: Not on file  .  Stress: Not on file  Relationships  . Social Herbalist on phone: Not on file    Gets together: Not on file    Attends religious service: Not on file    Active member of club or organization: Not on file    Attends meetings of clubs or organizations: Not on file    Relationship status: Not on file  . Intimate partner violence    Fear of current or ex partner: Not on file    Emotionally abused: Not on file    Physically abused: Not on file    Forced sexual activity: Not on file  Other Topics Concern  . Not on file  Social History Narrative  . Not on file    Past Medical History, Surgical history, Social history, and Family history were reviewed and updated as appropriate.   Please see review of systems for further details on the patient's review from today.   Objective:   Physical Exam:  There were no vitals taken for this visit.  Physical Exam Constitutional:      General: She is not in acute distress.    Appearance: She is well-developed. She is obese.  Musculoskeletal:        General: No deformity.  Neurological:     Mental Status: She is alert and oriented to person, place, and time.     Cranial Nerves: No dysarthria.     Coordination: Coordination normal.  Psychiatric:        Attention and Perception: Attention and perception normal. She does not perceive auditory or visual hallucinations.        Mood and Affect: Mood is not anxious or depressed. Affect is not labile, blunt, angry or inappropriate.        Speech: Speech normal.        Behavior: Behavior normal. Behavior is cooperative.        Thought Content: Thought content normal. Thought content is not paranoid or delusional. Thought content does not include homicidal or suicidal ideation. Thought content does not include homicidal or suicidal plan.        Cognition and Memory: Cognition and memory normal.        Judgment: Judgment normal.     Lab Review:  No results found for: NA, K, CL, CO2,  GLUCOSE, BUN, CREATININE, CALCIUM, PROT, ALBUMIN, AST, ALT, ALKPHOS, BILITOT, GFRNONAA, GFRAA     Component Value Date/Time   WBC 8.6 06/03/2010 1056   RBC 4.77 06/03/2010 1056   HGB 14.8 06/03/2010 1056   HCT 44.0 06/03/2010 1056   PLT 319 06/03/2010 1056   MCV 92.2 06/03/2010 1056   MCH 31.0 06/03/2010 1056   MCHC 33.6 06/03/2010 1056   RDW 12.7 06/03/2010 1056    No results found for: POCLITH, LITHIUM   No results found for: PHENYTOIN,  PHENOBARB, VALPROATE, CBMZ   .res Assessment: Plan:    No diagnosis found.   As noted Sharyn Lull has been on several different antidepressants over the years including SSS RI's and SNRIs.  She is taken lithium.  She has residual depression and would like help with that.  She has never been on a partial dopamine agonist so we will start Abilify 2.5 mg daily and if needed 5 mg daily.  Discussed side effects in detail.  Discussed potential metabolic side effects associated with atypical antipsychotics, as well as potential risk for movement side effects. Advised pt to contact office if movement side effects occur.   If she has a very good response we could consider reduction and the duloxetine.  Sleep is well controlled with the trazodone  30-minute appointment  Follow-up 8 weeks  Lynder Parents MD, DFAPA Please see After Visit Summary for patient specific instructions.  No future appointments.  No orders of the defined types were placed in this encounter.     -------------------------------

## 2018-12-11 DIAGNOSIS — M47812 Spondylosis without myelopathy or radiculopathy, cervical region: Secondary | ICD-10-CM | POA: Diagnosis not present

## 2018-12-25 DIAGNOSIS — M47812 Spondylosis without myelopathy or radiculopathy, cervical region: Secondary | ICD-10-CM | POA: Diagnosis not present

## 2019-01-01 DIAGNOSIS — M47812 Spondylosis without myelopathy or radiculopathy, cervical region: Secondary | ICD-10-CM | POA: Diagnosis not present

## 2019-03-01 ENCOUNTER — Other Ambulatory Visit: Payer: Self-pay | Admitting: Psychiatry

## 2019-03-01 DIAGNOSIS — F331 Major depressive disorder, recurrent, moderate: Secondary | ICD-10-CM

## 2019-03-08 DIAGNOSIS — H02825 Cysts of left lower eyelid: Secondary | ICD-10-CM | POA: Diagnosis not present

## 2019-03-14 DIAGNOSIS — E7849 Other hyperlipidemia: Secondary | ICD-10-CM | POA: Diagnosis not present

## 2019-03-14 DIAGNOSIS — I10 Essential (primary) hypertension: Secondary | ICD-10-CM | POA: Diagnosis not present

## 2019-03-14 DIAGNOSIS — R739 Hyperglycemia, unspecified: Secondary | ICD-10-CM | POA: Diagnosis not present

## 2019-03-18 DIAGNOSIS — K2100 Gastro-esophageal reflux disease with esophagitis, without bleeding: Secondary | ICD-10-CM | POA: Diagnosis not present

## 2019-03-18 DIAGNOSIS — K2270 Barrett's esophagus without dysplasia: Secondary | ICD-10-CM | POA: Diagnosis not present

## 2019-03-18 DIAGNOSIS — I10 Essential (primary) hypertension: Secondary | ICD-10-CM | POA: Diagnosis not present

## 2019-03-18 DIAGNOSIS — M4722 Other spondylosis with radiculopathy, cervical region: Secondary | ICD-10-CM | POA: Diagnosis not present

## 2019-03-18 DIAGNOSIS — Z23 Encounter for immunization: Secondary | ICD-10-CM | POA: Diagnosis not present

## 2019-05-03 IMAGING — CR DG NECK SOFT TISSUE
2 series · 2 of 2 positions shown · non-contrast
Comparison: None.

CLINICAL DATA: Patient feels like neck bone is stuck in her throat.

EXAM:
NECK SOFT TISSUES - 1+ VIEW

[neck lat]
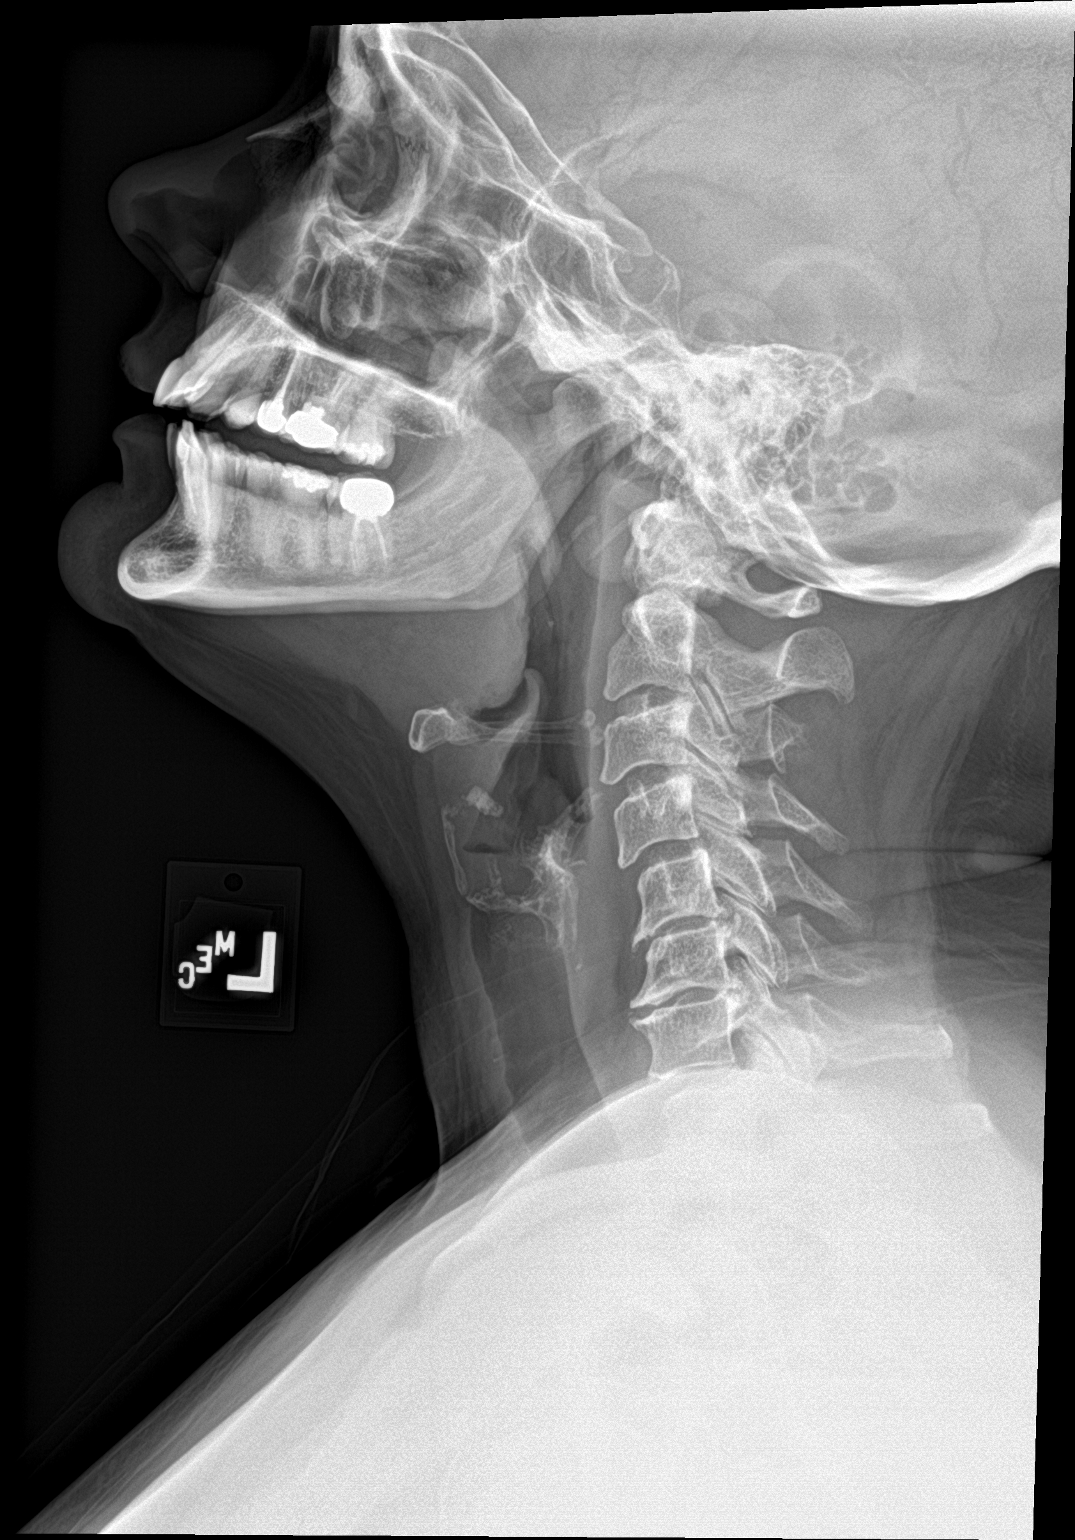

[neck ap]
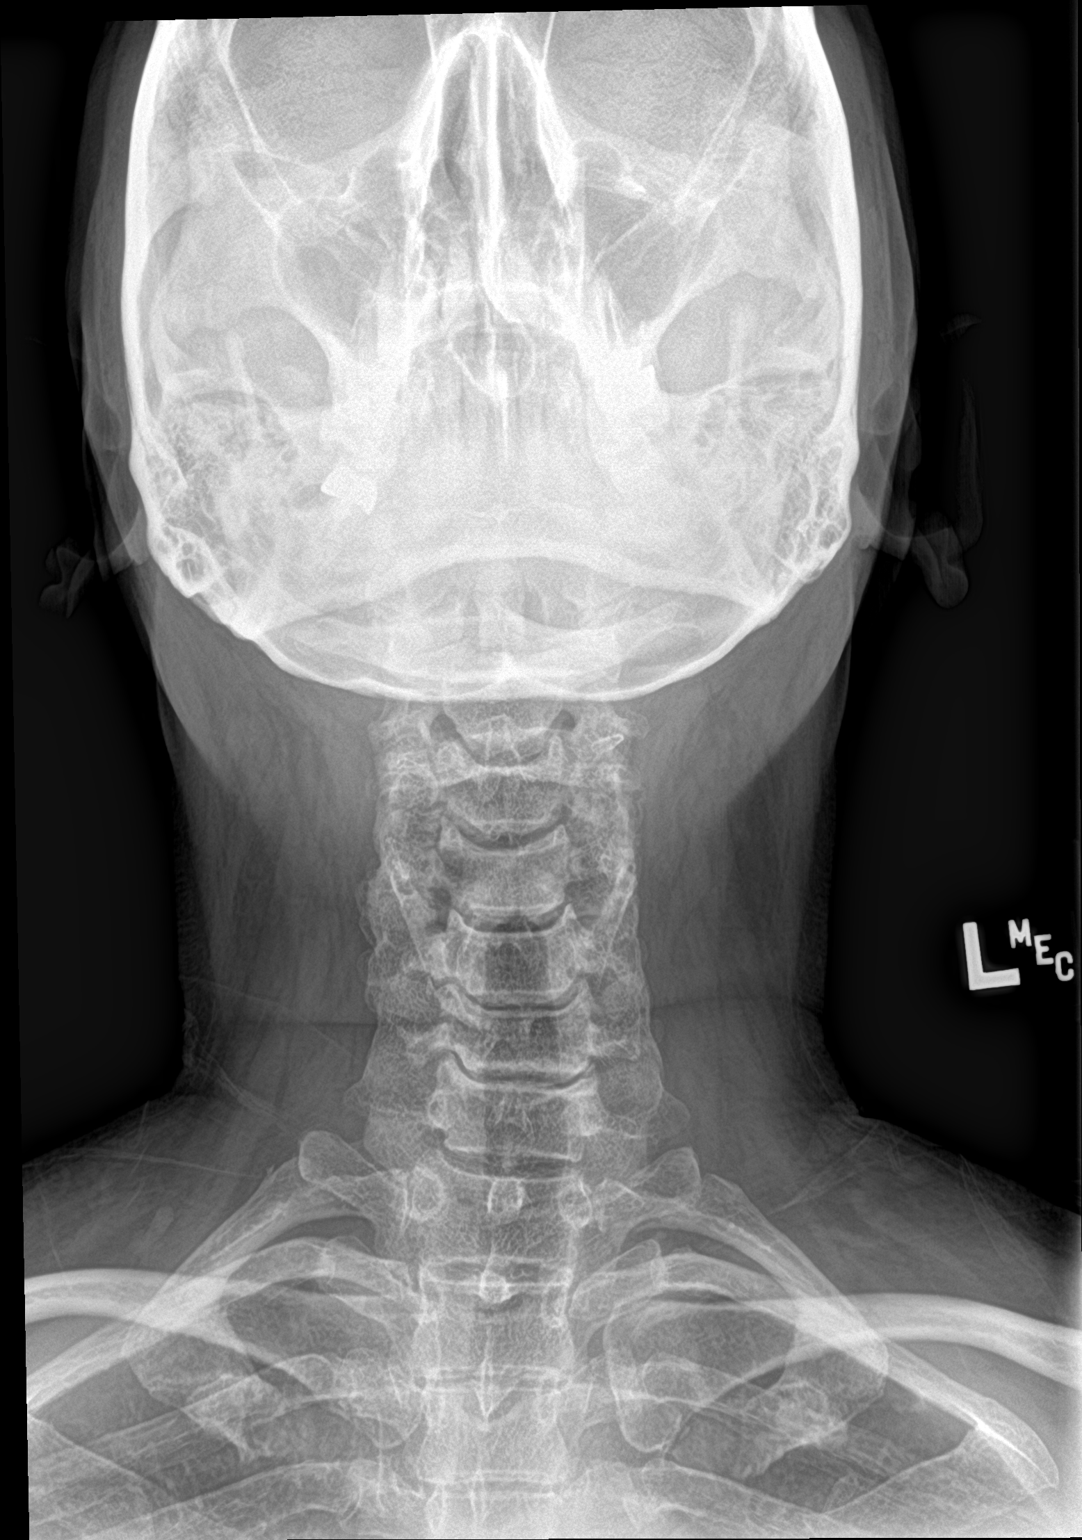

[2 of 2 positions shown; findings below may reference images not displayed]

FINDINGS: There is no evidence of retropharyngeal soft tissue swelling or
epiglottic enlargement. The cervical airway is unremarkable and no
radio-opaque foreign body identified. Degenerative joint changes of
the lower spine is identified.
IMPRESSION: No radiopaque foreign body identified.

## 2019-05-29 ENCOUNTER — Ambulatory Visit (INDEPENDENT_AMBULATORY_CARE_PROVIDER_SITE_OTHER): Payer: BC Managed Care – PPO | Admitting: Psychiatry

## 2019-05-29 ENCOUNTER — Other Ambulatory Visit: Payer: Self-pay

## 2019-05-29 ENCOUNTER — Encounter: Payer: Self-pay | Admitting: Psychiatry

## 2019-05-29 DIAGNOSIS — F5105 Insomnia due to other mental disorder: Secondary | ICD-10-CM | POA: Diagnosis not present

## 2019-05-29 DIAGNOSIS — F331 Major depressive disorder, recurrent, moderate: Secondary | ICD-10-CM

## 2019-05-29 MED ORDER — DULOXETINE HCL 60 MG PO CPEP: 60.0000 mg | ORAL_CAPSULE | Freq: Two times a day (BID) | ORAL | 1 refills | Status: DC

## 2019-05-29 MED ORDER — TRAZODONE HCL 100 MG PO TABS: 100.0000 mg | ORAL_TABLET | Freq: Every day | ORAL | 1 refills | Status: DC

## 2019-05-29 MED ORDER — ARIPIPRAZOLE 2 MG PO TABS: 2.0000 mg | ORAL_TABLET | Freq: Every day | ORAL | 1 refills | Status: DC

## 2019-05-29 NOTE — Progress Notes (Signed)
Julia [name] [id number] 1966-01-07 54 y.o.  Subjective:   Patient ID:  Julia [name] is a 54 y.o. (DOB [birth year]) female.  Chief Complaint:  Chief Complaint  Patient presents with  . Follow-up    Medication Managemenet  . Depression    Medication Managemenet    HPI Julia [name] presents to the office today for follow-up of major depression and dysthymia.  At her visit September 25, 2018 she had had more mood swings and depression.  She was on duloxetine 60 mg twice daily and trazodone 100 mg nightly.  She was started on Abilify as an augmentation agent.  At her last visit August 2020.  She reported good response to Abilify potentiation with resolution of depression.  Really good mood.  Maintained benefit with meds.  Better relationships with D.   Fantastic.  Better energy and motivation and mood.  Resolution of death thoughts and irritability.  Very stable at just the 2.5 mg Abilify.  No depression since.  Anxiety managed.  No SE except initially a little mindless eating resolve.  H has noticed and complemented.  Sleep is good.  Initially EMA and overall easier to get OOB in the morning.  At least 7 hours of sleep.  Hard to split Abilify 5 mg.  Patient denies any recent difficulty with anxiety.  Patient denies difficulty with sleep initiation or maintenance with trazodone. Denies appetite disturbance.  Patient reports that energy and motivation are not great.  Patient denies any difficulty with concentration.  Patient denies any suicidal ideation.  Watches gkids every other week.   M-in-law with Covid complications.  Past Psychiatric Medication Trials: Duloxetine 120, venlafaxine, Wellbutrin irritable, sertraline, citalopram, lithium, Ambien side effects, trazodone, hydroxyzine,, alprazolam, Sonata  Review of Systems:  Review of Systems  Neurological: Negative for dizziness, tremors and weakness.  Psychiatric/Behavioral: Positive for depression.    Medications: I have  reviewed the patient's current medications.  Current Outpatient Medications  Medication Sig Dispense Refill  . fluticasone (FLONASE) 50 MCG/ACT nasal spray Place into the nose.    . Multiple Vitamins-Minerals (MULTIVITAMIN ADULTS PO) Take by mouth.    . pantoprazole (PROTONIX) 40 MG tablet 40 mg.    . ARIPiprazole (ABILIFY) 2 MG tablet Take 1 tablet (2 mg total) by mouth daily. 90 tablet 1  . DULoxetine (CYMBALTA) 60 MG capsule Take 1 capsule (60 mg total) by mouth 2 (two) times daily. 180 capsule 1  . traZODone (DESYREL) 100 MG tablet Take 1 tablet (100 mg total) by mouth at bedtime. 180 tablet 1   No current facility-administered medications for this visit.    Medication Side Effects: Other: sweating resolved  Allergies:  Allergies  Allergen Reactions  . Citalopram Other (See Comments)    Weight gain Weight gain   . Olanzapine Other (See Comments)    Weight gain Weight gain   . Penicillin G Other (See Comments)    other  . Telithromycin Diarrhea, Nausea Only and Nausea And Vomiting  . Penicillin V Potassium Rash    Tolerated Ancef 01/24/17     History reviewed. No pertinent past medical history.  History reviewed. No pertinent family history.  Social History   Socioeconomic History  . Marital status: Married    Spouse name: Not on file  . Number of children: Not on file  . Years of education: Not on file  . Highest education level: Not on file  Occupational History  . Not on file  Tobacco Use  . Smoking status:  Former Smoker  . Smokeless tobacco: Never Used  Substance and Sexual Activity  . Alcohol use: Never  . Drug use: Never  . Sexual activity: Yes  Other Topics Concern  . Not on file  Social History Narrative  . Not on file   Social Determinants of Health   Financial Resource Strain:   . Difficulty of Paying Living Expenses: Not on file  Food Insecurity:   . Worried About Charity fundraiser in the Last Year: Not on file  . Ran Out of Food in  the Last Year: Not on file  Transportation Needs:   . Lack of Transportation (Medical): Not on file  . Lack of Transportation (Non-Medical): Not on file  Physical Activity:   . Days of Exercise per Week: Not on file  . Minutes of Exercise per Session: Not on file  Stress:   . Feeling of Stress : Not on file  Social Connections:   . Frequency of Communication with Friends and Family: Not on file  . Frequency of Social Gatherings with Friends and Family: Not on file  . Attends Religious Services: Not on file  . Active Member of Clubs or Organizations: Not on file  . Attends Archivist Meetings: Not on file  . Marital Status: Not on file  Intimate Partner Violence:   . Fear of Current or Ex-Partner: Not on file  . Emotionally Abused: Not on file  . Physically Abused: Not on file  . Sexually Abused: Not on file    Past Medical History, Surgical history, Social history, and Family history were reviewed and updated as appropriate.   Please see review of systems for further details on the patient's review from today.   Objective:   Physical Exam:  There were no vitals taken for this visit.  Physical Exam Constitutional:      General: She is not in acute distress.    Appearance: She is well-developed. She is obese.  Musculoskeletal:        General: No deformity.  Neurological:     Mental Status: She is alert and oriented to person, place, and time.     Cranial Nerves: No dysarthria.     Coordination: Coordination normal.  Psychiatric:        Attention and Perception: Attention and perception normal. She does not perceive auditory or visual hallucinations.        Mood and Affect: Mood is not anxious or depressed. Affect is not labile, blunt, angry or inappropriate.        Speech: Speech normal.        Behavior: Behavior normal. Behavior is cooperative.        Thought Content: Thought content normal. Thought content is not paranoid or delusional. Thought content does  not include homicidal or suicidal ideation. Thought content does not include homicidal or suicidal plan.        Cognition and Memory: Cognition and memory normal.        Judgment: Judgment normal.     Comments: Of note patient arrived over 15 minutes late for 1 PM appointment.     Lab Review:  No results found for: NA, K, CL, CO2, GLUCOSE, BUN, CREATININE, CALCIUM, PROT, ALBUMIN, AST, ALT, ALKPHOS, BILITOT, GFRNONAA, GFRAA     Component Value Date/Time   WBC 8.6 06/03/2010 1056   RBC 4.77 06/03/2010 1056   HGB 14.8 06/03/2010 1056   HCT 44.0 06/03/2010 1056   PLT 319 06/03/2010 1056   MCV 92.2  06/03/2010 1056   MCH 31.0 06/03/2010 1056   MCHC 33.6 06/03/2010 1056   RDW 12.7 06/03/2010 1056    No results found for: POCLITH, LITHIUM   No results found for: PHENYTOIN, PHENOBARB, VALPROATE, CBMZ   .res Assessment: Plan:    Major depressive disorder, recurrent episode, moderate ([place]) - Plan: ARIPiprazole (ABILIFY) 2 MG tablet, DULoxetine (CYMBALTA) 60 MG capsule  Insomnia due to mental condition - Plan: traZODone (DESYREL) 100 MG tablet   As noted Julia [name] has been on several different antidepressants over the years including SSS RI's and SNRIs.  She is taken lithium.  She had residual depression and it resolved on Abilify 2.5 mg daily.   Discussed side effects in detail.  Switch to Abilify 2 mg daily to keep from having to split pills.  Discussed potential metabolic side effects associated with atypical antipsychotics, as well as potential risk for movement side effects. Advised pt to contact office if movement side effects occur.   If she has a very good response we could consider reduction and the duloxetine.  Sleep is well controlled with the trazodone  30-minute appointment  Follow-up 6 mos  Lynder Parents MD, DFAPA Please see After Visit Summary for patient specific instructions.  No future appointments.  No orders of the defined types were placed in this  encounter.     -------------------------------

## 2019-11-26 ENCOUNTER — Encounter: Payer: Self-pay | Admitting: Psychiatry

## 2019-11-26 ENCOUNTER — Other Ambulatory Visit: Payer: Self-pay

## 2019-11-26 ENCOUNTER — Ambulatory Visit (INDEPENDENT_AMBULATORY_CARE_PROVIDER_SITE_OTHER): Payer: BC Managed Care – PPO | Admitting: Psychiatry

## 2019-11-26 DIAGNOSIS — F5105 Insomnia due to other mental disorder: Secondary | ICD-10-CM | POA: Diagnosis not present

## 2019-11-26 DIAGNOSIS — F331 Major depressive disorder, recurrent, moderate: Secondary | ICD-10-CM

## 2019-11-26 MED ORDER — TRAZODONE HCL 100 MG PO TABS: 100.0000 mg | ORAL_TABLET | Freq: Every day | ORAL | 3 refills | Status: DC

## 2019-11-26 MED ORDER — DULOXETINE HCL 60 MG PO CPEP: 60.0000 mg | ORAL_CAPSULE | Freq: Two times a day (BID) | ORAL | 3 refills | Status: AC

## 2019-11-26 MED ORDER — ARIPIPRAZOLE 2 MG PO TABS: 2.0000 mg | ORAL_TABLET | Freq: Every day | ORAL | 3 refills | Status: DC

## 2019-11-26 NOTE — Progress Notes (Signed)
[name] [id number] [birth year] 54 y.o.  Subjective:   Patient ID:  Julia [name] is a 54 y.o. (DOB 02/19/1966) female.  Chief Complaint:  Chief Complaint  Patient presents with  . Follow-up  . Depression    Depression        [name] presents to the office today for follow-up of major depression and dysthymia.  At her visit September 25, 2018 she had had more mood swings and depression.  She was on duloxetine 60 mg twice daily and trazodone 100 mg nightly.  She was started on Abilify as an augmentation agent.  At her last visit August 2020.  She reported good response to Abilify potentiation with resolution of depression.  February 2021 appointment with the following noted: Really good mood.  Maintained benefit with meds.  Better relationships with D.   Fantastic.  Better energy and motivation and mood.  Resolution of death thoughts and irritability.  Very stable at just the 2.5 mg Abilify.  No depression since.  Anxiety managed.  No SE except initially a little mindless eating resolve.  H has noticed and complemented.  Sleep is good.  Initially EMA and overall easier to get OOB in the morning.  At least 7 hours of sleep. She was switched from Abilify 2.5 mg daily to 2 mg daily so she would not have to split pills.  11/26/2019 appointment with the following noted: Busy.  2 D's married this year and bought a house at the [place].  Enjoys it.  Relaxes her.   Still depression managed.  Satisfied with meds. Loretha Brasil had major MI but doing well.  Stented. More active now generally.    Patient denies any recent difficulty with anxiety.  Patient denies difficulty with sleep initiation or maintenance with trazodone. Poor sleep without it.  Denies appetite disturbance.  Patient reports that energy and motivation are not great.  Patient denies any difficulty with concentration.  Patient denies any suicidal ideation.  Watches gkids every other week.    Past Psychiatric Medication Trials:  Duloxetine 120, venlafaxine, Wellbutrin irritable, sertraline, citalopram, lithium, Ambien side effects, trazodone, hydroxyzine,, alprazolam, Sonata  Review of Systems:  Review of Systems  Cardiovascular: Negative for chest pain.  Neurological: Negative for dizziness, tremors and weakness.  Psychiatric/Behavioral: Positive for depression.    Medications: I have reviewed the patient's current medications.  Current Outpatient Medications  Medication Sig Dispense Refill  . Multiple Vitamins-Minerals (MULTIVITAMIN ADULTS PO) Take by mouth.    . pantoprazole (PROTONIX) 40 MG tablet 40 mg.    . ARIPiprazole (ABILIFY) 2 MG tablet Take 1 tablet (2 mg total) by mouth daily. 90 tablet 3  . DULoxetine (CYMBALTA) 60 MG capsule Take 1 capsule (60 mg total) by mouth 2 (two) times daily. 180 capsule 3  . fluticasone (FLONASE) 50 MCG/ACT nasal spray Place into the nose.    . traZODone (DESYREL) 100 MG tablet Take 1 tablet (100 mg total) by mouth at bedtime. 180 tablet 3   No current facility-administered medications for this visit.    Medication Side Effects: Other: sweating resolved  Allergies:  Allergies  Allergen Reactions  . Citalopram Other (See Comments)    Weight gain Weight gain   . Olanzapine Other (See Comments)    Weight gain Weight gain   . Penicillin G Other (See Comments)    other  . Telithromycin Diarrhea, Nausea Only and Nausea And Vomiting  . Penicillin V Potassium Rash    Tolerated Ancef 01/24/17  History reviewed. No pertinent past medical history.  History reviewed. No pertinent family history.  Social History   Socioeconomic History  . Marital status: Married    Spouse name: Not on file  . Number of children: Not on file  . Years of education: Not on file  . Highest education level: Not on file  Occupational History  . Not on file  Tobacco Use  . Smoking status: Former Research scientist (life sciences)  . Smokeless tobacco: Never Used  Substance and Sexual Activity  .  Alcohol use: Never  . Drug use: Never  . Sexual activity: Yes  Other Topics Concern  . Not on file  Social History Narrative  . Not on file   Social Determinants of Health   Financial Resource Strain:   . Difficulty of Paying Living Expenses:   Food Insecurity:   . Worried About Charity fundraiser in the Last Year:   . Arboriculturist in the Last Year:   Transportation Needs:   . Film/video editor (Medical):   Marland Kitchen Lack of Transportation (Non-Medical):   Physical Activity:   . Days of Exercise per Week:   . Minutes of Exercise per Session:   Stress:   . Feeling of Stress :   Social Connections:   . Frequency of Communication with Friends and Family:   . Frequency of Social Gatherings with Friends and Family:   . Attends Religious Services:   . Active Member of Clubs or Organizations:   . Attends Archivist Meetings:   Marland Kitchen Marital Status:   Intimate Partner Violence:   . Fear of Current or Ex-Partner:   . Emotionally Abused:   Marland Kitchen Physically Abused:   . Sexually Abused:     Past Medical History, Surgical history, Social history, and Family history were reviewed and updated as appropriate.   Please see review of systems for further details on the patient's review from today.   Objective:   Physical Exam:  There were no vitals taken for this visit.  Physical Exam Constitutional:      General: She is not in acute distress.    Appearance: She is well-developed. She is obese.  Musculoskeletal:        General: No deformity.  Neurological:     Mental Status: She is alert and oriented to person, place, and time.     Cranial Nerves: No dysarthria.     Coordination: Coordination normal.  Psychiatric:        Attention and Perception: Attention and perception normal. She does not perceive auditory or visual hallucinations.        Mood and Affect: Mood is not anxious or depressed. Affect is not labile, blunt, angry or inappropriate.        Speech: Speech normal.         Behavior: Behavior normal. Behavior is cooperative.        Thought Content: Thought content normal. Thought content is not paranoid or delusional. Thought content does not include homicidal or suicidal ideation. Thought content does not include homicidal or suicidal plan.        Cognition and Memory: Cognition and memory normal.        Judgment: Judgment normal.     Comments: Of note patient arrived over 15 minutes late for 1 PM appointment.     Lab Review:  No results found for: NA, K, CL, CO2, GLUCOSE, BUN, CREATININE, CALCIUM, PROT, ALBUMIN, AST, ALT, ALKPHOS, BILITOT, GFRNONAA, GFRAA  Component Value Date/Time   WBC 8.6 06/03/2010 1056   RBC 4.77 06/03/2010 1056   HGB 14.8 06/03/2010 1056   HCT 44.0 06/03/2010 1056   PLT 319 06/03/2010 1056   MCV 92.2 06/03/2010 1056   MCH 31.0 06/03/2010 1056   MCHC 33.6 06/03/2010 1056   RDW 12.7 06/03/2010 1056    No results found for: POCLITH, LITHIUM   No results found for: PHENYTOIN, PHENOBARB, VALPROATE, CBMZ   .res Assessment: Plan:    Major depressive disorder, recurrent episode, moderate ([place]) - Plan: ARIPiprazole (ABILIFY) 2 MG tablet, DULoxetine (CYMBALTA) 60 MG capsule  Insomnia due to mental condition - Plan: traZODone (DESYREL) 100 MG tablet   As noted Sharyn Lull has been on several different antidepressants over the years including SSS RI's and SNRIs.  She is taken lithium.  She had residual depression and it resolved on Abilify 2 mg daily.   Discussed side effects in detail.  No med changes  Discussed potential metabolic side effects associated with atypical antipsychotics, as well as potential risk for movement side effects. Advised pt to contact office if movement side effects occur.   If she has a very good response we could consider reduction and the duloxetine.  Sleep is well controlled with the trazodone  30-minute appointment  Follow-up 6 mos  Lynder Parents MD, DFAPA Please see After Visit Summary  for patient specific instructions.  No future appointments.  No orders of the defined types were placed in this encounter.     -------------------------------

## 2019-12-15 ENCOUNTER — Other Ambulatory Visit: Payer: Self-pay | Admitting: Psychiatry

## 2019-12-15 DIAGNOSIS — F5105 Insomnia due to other mental disorder: Secondary | ICD-10-CM

## 2020-01-10 ENCOUNTER — Other Ambulatory Visit: Payer: Self-pay | Admitting: Student

## 2020-01-10 DIAGNOSIS — R195 Other fecal abnormalities: Secondary | ICD-10-CM

## 2020-01-10 DIAGNOSIS — R102 Pelvic and perineal pain: Secondary | ICD-10-CM

## 2020-01-15 ENCOUNTER — Other Ambulatory Visit: Payer: Self-pay

## 2020-01-15 ENCOUNTER — Ambulatory Visit
Admission: RE | Admit: 2020-01-15 | Discharge: 2020-01-15 | Disposition: A | Discharge status: Home / Self Care | Payer: BC Managed Care – PPO | Source: Ambulatory Visit | Attending: Student | Admitting: Student

## 2020-01-15 DIAGNOSIS — R195 Other fecal abnormalities: Secondary | ICD-10-CM

## 2020-01-15 DIAGNOSIS — R102 Pelvic and perineal pain: Secondary | ICD-10-CM | POA: Diagnosis present

## 2020-01-15 MED ORDER — IOHEXOL 300 MG/ML  SOLN
100.0000 mL | Freq: Once | INTRAMUSCULAR | Status: AC | PRN
  Administered 2020-01-15: 100 mL via INTRAVENOUS

## 2020-05-19 ENCOUNTER — Other Ambulatory Visit: Payer: Self-pay | Admitting: Psychiatry

## 2020-05-19 DIAGNOSIS — F331 Major depressive disorder, recurrent, moderate: Secondary | ICD-10-CM

## 2020-08-25 ENCOUNTER — Ambulatory Visit: Payer: BC Managed Care – PPO | Admitting: Psychiatry

## 2020-09-04 DIAGNOSIS — M542 Cervicalgia: Secondary | ICD-10-CM | POA: Insufficient documentation

## 2020-09-04 DIAGNOSIS — M7918 Myalgia, other site: Secondary | ICD-10-CM | POA: Insufficient documentation

## 2020-09-04 DIAGNOSIS — M5412 Radiculopathy, cervical region: Secondary | ICD-10-CM | POA: Insufficient documentation

## 2020-09-04 DIAGNOSIS — M549 Dorsalgia, unspecified: Secondary | ICD-10-CM | POA: Insufficient documentation

## 2020-10-19 ENCOUNTER — Other Ambulatory Visit: Payer: Self-pay | Admitting: Gastroenterology

## 2020-10-19 ENCOUNTER — Other Ambulatory Visit (HOSPITAL_COMMUNITY): Payer: Self-pay | Admitting: Gastroenterology

## 2020-10-19 DIAGNOSIS — K6289 Other specified diseases of anus and rectum: Secondary | ICD-10-CM

## 2020-10-19 DIAGNOSIS — R102 Pelvic and perineal pain: Secondary | ICD-10-CM

## 2020-10-28 ENCOUNTER — Ambulatory Visit: Payer: BC Managed Care – PPO

## 2020-12-15 ENCOUNTER — Other Ambulatory Visit: Payer: Self-pay | Admitting: Psychiatry

## 2020-12-15 DIAGNOSIS — F5105 Insomnia due to other mental disorder: Secondary | ICD-10-CM

## 2021-01-08 ENCOUNTER — Other Ambulatory Visit: Payer: Self-pay | Admitting: Psychiatry

## 2021-01-08 DIAGNOSIS — F331 Major depressive disorder, recurrent, moderate: Secondary | ICD-10-CM

## 2021-01-11 NOTE — Telephone Encounter (Signed)
Please schedule appt

## 2021-01-12 NOTE — Telephone Encounter (Signed)
Left message for pt to call and schedule appt 

## 2021-01-13 ENCOUNTER — Other Ambulatory Visit: Payer: Self-pay | Admitting: Psychiatry

## 2021-01-13 DIAGNOSIS — F331 Major depressive disorder, recurrent, moderate: Secondary | ICD-10-CM

## 2021-01-13 NOTE — Telephone Encounter (Signed)
Patient called in for refill on Cymbalta 60mg . States she is out and is going out of town Saturday for a week. Ph: [phone]. Pharmacy [facility] [place]

## 2021-01-14 ENCOUNTER — Other Ambulatory Visit: Payer: Self-pay | Admitting: Psychiatry

## 2021-01-14 DIAGNOSIS — F331 Major depressive disorder, recurrent, moderate: Secondary | ICD-10-CM

## 2021-01-14 NOTE — Telephone Encounter (Signed)
Left message for pt to schedule

## 2021-01-14 NOTE — Telephone Encounter (Signed)
I can't even tell you how many requests she has sent.I keep refusing them she has not been here since 2021 and won't make an appt

## 2021-01-14 NOTE — Telephone Encounter (Signed)
Please schedule appt

## 2021-01-14 NOTE — Telephone Encounter (Signed)
Pt has not been in since 11/2019, with a no show 08/2020 please review phone message

## 2021-03-12 ENCOUNTER — Other Ambulatory Visit: Payer: Self-pay | Admitting: Radiology

## 2021-03-16 ENCOUNTER — Other Ambulatory Visit: Payer: Self-pay | Admitting: Internal Medicine

## 2021-03-16 DIAGNOSIS — R748 Abnormal levels of other serum enzymes: Secondary | ICD-10-CM

## 2021-03-16 DIAGNOSIS — I10 Essential (primary) hypertension: Secondary | ICD-10-CM

## 2021-03-26 ENCOUNTER — Ambulatory Visit
Admission: RE | Admit: 2021-03-26 | Discharge: 2021-03-26 | Disposition: A | Discharge status: Home / Self Care | Payer: BC Managed Care – PPO | Source: Ambulatory Visit | Attending: Internal Medicine | Admitting: Internal Medicine

## 2021-03-26 ENCOUNTER — Other Ambulatory Visit: Payer: Self-pay

## 2021-03-26 DIAGNOSIS — I10 Essential (primary) hypertension: Secondary | ICD-10-CM | POA: Diagnosis present

## 2021-03-26 DIAGNOSIS — R748 Abnormal levels of other serum enzymes: Secondary | ICD-10-CM | POA: Insufficient documentation

## 2021-03-30 DIAGNOSIS — J358 Other chronic diseases of tonsils and adenoids: Secondary | ICD-10-CM | POA: Insufficient documentation

## 2021-03-30 DIAGNOSIS — J3501 Chronic tonsillitis: Secondary | ICD-10-CM | POA: Insufficient documentation

## 2021-04-06 ENCOUNTER — Ambulatory Visit: Payer: Self-pay | Admitting: General Surgery

## 2021-04-06 DIAGNOSIS — N6489 Other specified disorders of breast: Secondary | ICD-10-CM

## 2021-05-13 ENCOUNTER — Other Ambulatory Visit: Payer: Self-pay

## 2021-05-13 ENCOUNTER — Encounter (HOSPITAL_BASED_OUTPATIENT_CLINIC_OR_DEPARTMENT_OTHER): Payer: Self-pay | Admitting: General Surgery

## 2021-05-20 NOTE — Anesthesia Preprocedure Evaluation (Addendum)
Anesthesia Evaluation  Patient identified by MRN, date of birth, ID band Patient awake    Reviewed: Allergy & Precautions, NPO status , Patient's Chart, lab work & pertinent test results  History of Anesthesia Complications (+) PONV and history of anesthetic complications  Airway Mallampati: II  TM Distance: >3 FB Neck ROM: Full    Dental no notable dental hx. (+) Dental Advisory Given   Pulmonary former smoker,    Pulmonary exam normal breath sounds clear to auscultation       Cardiovascular negative cardio ROS Normal cardiovascular exam Rhythm:Regular Rate:Normal     Neuro/Psych negative neurological ROS     GI/Hepatic Neg liver ROS, GERD  ,  Endo/Other  negative endocrine ROS  Renal/GU negative Renal ROS     Musculoskeletal  (+) Arthritis ,   Abdominal   Peds  Hematology negative hematology ROS (+)   Anesthesia Other Findings   Reproductive/Obstetrics negative OB ROS                            Anesthesia Physical Anesthesia Plan  ASA: 2  Anesthesia Plan: General   Post-op Pain Management: Minimal or no pain anticipated, Tylenol PO (pre-op), Celebrex PO (pre-op) and Gabapentin PO (pre-op)   Induction: Intravenous  PONV Risk Score and Plan: 4 or greater and Midazolam, Ondansetron, Dexamethasone, Treatment may vary due to age or medical condition and Scopolamine patch - Pre-op  Airway Management Planned: LMA  Additional Equipment:   Intra-op Plan:   Post-operative Plan: Extubation in OR  Informed Consent: I have reviewed the patients History and Physical, chart, labs and discussed the procedure including the risks, benefits and alternatives for the proposed anesthesia with the patient or authorized representative who has indicated his/her understanding and acceptance.     Dental advisory given  Plan Discussed with: CRNA  Anesthesia Plan Comments:         Anesthesia Quick Evaluation

## 2021-05-20 NOTE — Progress Notes (Signed)
Pt given CHG soap and verbalized understanding.      Patient Instructions  The night before surgery:  No food after midnight. ONLY clear liquids after midnight  The day of surgery (if you do NOT have diabetes):  Drink ONE (1) Pre-Surgery Clear Ensure as directed.   This drink was given to you during your hospital  pre-op appointment visit. The pre-op nurse will instruct you on the time to drink the  Pre-Surgery Ensure depending on your surgery time. Finish the drink at the designated time by the pre-op nurse.  Nothing else to drink after completing the  Pre-Surgery Clear Ensure.  The day of surgery (if you have diabetes): Drink ONE (1) Gatorade 2 (G2) as directed. This drink was given to you during your hospital  pre-op appointment visit.  The pre-op nurse will instruct you on the time to drink the   Gatorade 2 (G2) depending on your surgery time. Color of the Gatorade may vary. Red is not allowed. Nothing else to drink after completing the  Gatorade 2 (G2).         If you have questions, please contact your surgeons office.

## 2021-05-21 ENCOUNTER — Encounter (HOSPITAL_BASED_OUTPATIENT_CLINIC_OR_DEPARTMENT_OTHER)
Admission: RE | Disposition: A | Discharge status: Home / Self Care | Payer: Self-pay | Source: Home / Self Care | Attending: General Surgery

## 2021-05-21 ENCOUNTER — Ambulatory Visit (HOSPITAL_BASED_OUTPATIENT_CLINIC_OR_DEPARTMENT_OTHER)
Admission: RE | Admit: 2021-05-21 | Discharge: 2021-05-21 | Disposition: A | Discharge status: Home / Self Care | Payer: BC Managed Care – PPO | Attending: General Surgery | Admitting: General Surgery

## 2021-05-21 ENCOUNTER — Other Ambulatory Visit: Payer: Self-pay

## 2021-05-21 ENCOUNTER — Ambulatory Visit (HOSPITAL_BASED_OUTPATIENT_CLINIC_OR_DEPARTMENT_OTHER): Payer: BC Managed Care – PPO | Admitting: Anesthesiology

## 2021-05-21 ENCOUNTER — Encounter (HOSPITAL_BASED_OUTPATIENT_CLINIC_OR_DEPARTMENT_OTHER): Payer: Self-pay | Admitting: General Surgery

## 2021-05-21 DIAGNOSIS — N6082 Other benign mammary dysplasias of left breast: Secondary | ICD-10-CM | POA: Insufficient documentation

## 2021-05-21 DIAGNOSIS — N6489 Other specified disorders of breast: Secondary | ICD-10-CM | POA: Insufficient documentation

## 2021-05-21 DIAGNOSIS — Z807 Family history of other malignant neoplasms of lymphoid, hematopoietic and related tissues: Secondary | ICD-10-CM | POA: Insufficient documentation

## 2021-05-21 DIAGNOSIS — K219 Gastro-esophageal reflux disease without esophagitis: Secondary | ICD-10-CM | POA: Diagnosis not present

## 2021-05-21 DIAGNOSIS — Z801 Family history of malignant neoplasm of trachea, bronchus and lung: Secondary | ICD-10-CM | POA: Insufficient documentation

## 2021-05-21 DIAGNOSIS — Z87891 Personal history of nicotine dependence: Secondary | ICD-10-CM | POA: Insufficient documentation

## 2021-05-21 HISTORY — PX: BREAST LUMPECTOMY WITH RADIOACTIVE SEED LOCALIZATION: SHX6424

## 2021-05-21 HISTORY — DX: Gastro-esophageal reflux disease without esophagitis: K21.9

## 2021-05-21 HISTORY — DX: Hyperlipidemia, unspecified: E78.5

## 2021-05-21 HISTORY — DX: Malignant (primary) neoplasm, unspecified: C80.1

## 2021-05-21 HISTORY — DX: Unspecified osteoarthritis, unspecified site: M19.90

## 2021-05-21 SURGERY — BREAST LUMPECTOMY WITH RADIOACTIVE SEED LOCALIZATION
Anesthesia: General | Site: Breast | Laterality: Left

## 2021-05-21 MED ORDER — HYDROCODONE-ACETAMINOPHEN 5-325 MG PO TABS: 1.0000 | ORAL_TABLET | Freq: Four times a day (QID) | ORAL | 0 refills | Status: DC | PRN

## 2021-05-21 MED ORDER — GABAPENTIN 300 MG PO CAPS
ORAL_CAPSULE | ORAL | Status: AC
  Filled 2021-05-21: qty 1

## 2021-05-21 MED ORDER — GABAPENTIN 300 MG PO CAPS
300.0000 mg | ORAL_CAPSULE | ORAL | Status: AC
  Administered 2021-05-21: 300 mg via ORAL

## 2021-05-21 MED ORDER — ONDANSETRON HCL 4 MG/2ML IJ SOLN
INTRAMUSCULAR | Status: DC | PRN
  Administered 2021-05-21: 4 mg via INTRAVENOUS

## 2021-05-21 MED ORDER — DEXAMETHASONE SODIUM PHOSPHATE 10 MG/ML IJ SOLN
INTRAMUSCULAR | Status: AC
  Filled 2021-05-21: qty 1

## 2021-05-21 MED ORDER — FENTANYL CITRATE (PF) 100 MCG/2ML IJ SOLN
INTRAMUSCULAR | Status: DC | PRN
Start: 2021-05-21 — End: 2021-05-21
  Administered 2021-05-21 (×2): 50 ug via INTRAVENOUS

## 2021-05-21 MED ORDER — PHENYLEPHRINE 40 MCG/ML (10ML) SYRINGE FOR IV PUSH (FOR BLOOD PRESSURE SUPPORT)
PREFILLED_SYRINGE | INTRAVENOUS | Status: AC
  Filled 2021-05-21: qty 10

## 2021-05-21 MED ORDER — PROMETHAZINE HCL 25 MG/ML IJ SOLN: 6.2500 mg | INTRAMUSCULAR | Status: DC | PRN

## 2021-05-21 MED ORDER — CHLORHEXIDINE GLUCONATE CLOTH 2 % EX PADS: 6.0000 | MEDICATED_PAD | Freq: Once | CUTANEOUS | Status: DC

## 2021-05-21 MED ORDER — MIDAZOLAM HCL 5 MG/5ML IJ SOLN
INTRAMUSCULAR | Status: DC | PRN
  Administered 2021-05-21: 2 mg via INTRAVENOUS

## 2021-05-21 MED ORDER — LIDOCAINE 2% (20 MG/ML) 5 ML SYRINGE
INTRAMUSCULAR | Status: AC
  Filled 2021-05-21: qty 5

## 2021-05-21 MED ORDER — SCOPOLAMINE 1 MG/3DAYS TD PT72: 1.0000 | MEDICATED_PATCH | TRANSDERMAL | Status: DC

## 2021-05-21 MED ORDER — VANCOMYCIN HCL IN DEXTROSE 1-5 GM/200ML-% IV SOLN
INTRAVENOUS | Status: AC
  Filled 2021-05-21: qty 200

## 2021-05-21 MED ORDER — ACETAMINOPHEN 500 MG PO TABS
1000.0000 mg | ORAL_TABLET | ORAL | Status: AC
Start: 2021-05-21 — End: 2021-05-21
  Administered 2021-05-21: 1000 mg via ORAL

## 2021-05-21 MED ORDER — HYDROMORPHONE HCL 1 MG/ML IJ SOLN: 0.2500 mg | INTRAMUSCULAR | Status: DC | PRN

## 2021-05-21 MED ORDER — ACETAMINOPHEN 500 MG PO TABS
ORAL_TABLET | ORAL | Status: AC
  Filled 2021-05-21: qty 2

## 2021-05-21 MED ORDER — ONDANSETRON HCL 4 MG/2ML IJ SOLN
INTRAMUSCULAR | Status: AC
  Filled 2021-05-21: qty 2

## 2021-05-21 MED ORDER — ATROPINE SULFATE 0.4 MG/ML IV SOLN
INTRAVENOUS | Status: AC
  Filled 2021-05-21: qty 1

## 2021-05-21 MED ORDER — SUCCINYLCHOLINE CHLORIDE 200 MG/10ML IV SOSY
PREFILLED_SYRINGE | INTRAVENOUS | Status: AC
  Filled 2021-05-21: qty 10

## 2021-05-21 MED ORDER — EPHEDRINE 5 MG/ML INJ
INTRAVENOUS | Status: AC
  Filled 2021-05-21: qty 5

## 2021-05-21 MED ORDER — DEXAMETHASONE SODIUM PHOSPHATE 10 MG/ML IJ SOLN
INTRAMUSCULAR | Status: DC | PRN
  Administered 2021-05-21: 5 mg via INTRAVENOUS

## 2021-05-21 MED ORDER — CELECOXIB 200 MG PO CAPS
200.0000 mg | ORAL_CAPSULE | ORAL | Status: AC
  Administered 2021-05-21: 200 mg via ORAL

## 2021-05-21 MED ORDER — MEPERIDINE HCL 25 MG/ML IJ SOLN: 6.2500 mg | INTRAMUSCULAR | Status: DC | PRN

## 2021-05-21 MED ORDER — FENTANYL CITRATE (PF) 100 MCG/2ML IJ SOLN
INTRAMUSCULAR | Status: AC
  Filled 2021-05-21: qty 2

## 2021-05-21 MED ORDER — VANCOMYCIN HCL IN DEXTROSE 1-5 GM/200ML-% IV SOLN
1000.0000 mg | INTRAVENOUS | Status: AC
  Administered 2021-05-21: 1000 mg via INTRAVENOUS

## 2021-05-21 MED ORDER — MIDAZOLAM HCL 2 MG/2ML IJ SOLN
INTRAMUSCULAR | Status: AC
  Filled 2021-05-21: qty 2

## 2021-05-21 MED ORDER — LACTATED RINGERS IV SOLN: INTRAVENOUS | Status: DC

## 2021-05-21 MED ORDER — PROPOFOL 10 MG/ML IV BOLUS
INTRAVENOUS | Status: DC | PRN
  Administered 2021-05-21: 200 mg via INTRAVENOUS

## 2021-05-21 MED ORDER — BUPIVACAINE-EPINEPHRINE (PF) 0.25% -1:200000 IJ SOLN
INTRAMUSCULAR | Status: DC | PRN
  Administered 2021-05-21: 20 mL

## 2021-05-21 MED ORDER — CELECOXIB 200 MG PO CAPS
ORAL_CAPSULE | ORAL | Status: AC
  Filled 2021-05-21: qty 1

## 2021-05-21 MED ORDER — LIDOCAINE HCL (CARDIAC) PF 100 MG/5ML IV SOSY
PREFILLED_SYRINGE | INTRAVENOUS | Status: DC | PRN
  Administered 2021-05-21: 30 mg via INTRAVENOUS

## 2021-05-21 SURGICAL SUPPLY — 42 items
ADH SKN CLS APL DERMABOND .7 (GAUZE/BANDAGES/DRESSINGS) ×1
APL PRP STRL LF DISP 70% ISPRP (MISCELLANEOUS) ×1
APPLIER CLIP 9.375 MED OPEN (MISCELLANEOUS)
APR CLP MED 9.3 20 MLT OPN (MISCELLANEOUS)
BLADE SURG 15 STRL LF DISP TIS (BLADE) ×1 IMPLANT
BLADE SURG 15 STRL SS (BLADE) ×2
CANISTER SUC SOCK COL 7IN (MISCELLANEOUS) ×2 IMPLANT
CANISTER SUCT 1200ML W/VALVE (MISCELLANEOUS) ×2 IMPLANT
CHLORAPREP W/TINT 26 (MISCELLANEOUS) ×2 IMPLANT
CLIP APPLIE 9.375 MED OPEN (MISCELLANEOUS) IMPLANT
COVER BACK TABLE 60X90IN (DRAPES) ×2 IMPLANT
COVER MAYO STAND STRL (DRAPES) ×2 IMPLANT
COVER PROBE W GEL 5X96 (DRAPES) ×2 IMPLANT
DECANTER SPIKE VIAL GLASS SM (MISCELLANEOUS) IMPLANT
DERMABOND ADVANCED (GAUZE/BANDAGES/DRESSINGS) ×1
DERMABOND ADVANCED .7 DNX12 (GAUZE/BANDAGES/DRESSINGS) ×1 IMPLANT
DRAPE LAPAROSCOPIC ABDOMINAL (DRAPES) ×2 IMPLANT
DRAPE UTILITY XL STRL (DRAPES) ×2 IMPLANT
ELECT COATED BLADE 2.86 ST (ELECTRODE) ×2 IMPLANT
ELECT REM PT RETURN 9FT ADLT (ELECTROSURGICAL) ×2
ELECTRODE REM PT RTRN 9FT ADLT (ELECTROSURGICAL) ×1 IMPLANT
GLOVE SURG ENC MOIS LTX SZ7.5 (GLOVE) ×4 IMPLANT
GOWN STRL REUS W/ TWL LRG LVL3 (GOWN DISPOSABLE) ×2 IMPLANT
GOWN STRL REUS W/TWL LRG LVL3 (GOWN DISPOSABLE) ×4
ILLUMINATOR WAVEGUIDE N/F (MISCELLANEOUS) IMPLANT
KIT MARKER MARGIN INK (KITS) ×2 IMPLANT
LIGHT WAVEGUIDE WIDE FLAT (MISCELLANEOUS) IMPLANT
NDL HYPO 25X1 1.5 SAFETY (NEEDLE) IMPLANT
NEEDLE HYPO 25X1 1.5 SAFETY (NEEDLE) IMPLANT
NS IRRIG 1000ML POUR BTL (IV SOLUTION) IMPLANT
PACK BASIN DAY SURGERY FS (CUSTOM PROCEDURE TRAY) ×2 IMPLANT
PENCIL SMOKE EVACUATOR (MISCELLANEOUS) ×2 IMPLANT
SLEEVE SCD COMPRESS KNEE MED (STOCKING) ×2 IMPLANT
SPONGE T-LAP 18X18 ~~LOC~~+RFID (SPONGE) ×2 IMPLANT
SUT MON AB 4-0 PC3 18 (SUTURE) ×2 IMPLANT
SUT SILK 2 0 SH (SUTURE) IMPLANT
SUT VICRYL 3-0 CR8 SH (SUTURE) ×2 IMPLANT
SYR CONTROL 10ML LL (SYRINGE) IMPLANT
TOWEL GREEN STERILE FF (TOWEL DISPOSABLE) ×2 IMPLANT
TRAY FAXITRON CT DISP (TRAY / TRAY PROCEDURE) ×2 IMPLANT
TUBE CONNECTING 20X1/4 (TUBING) ×2 IMPLANT
YANKAUER SUCT BULB TIP NO VENT (SUCTIONS) IMPLANT

## 2021-05-21 NOTE — Transfer of Care (Signed)
Immediate Anesthesia Transfer of Care Note  Patient: Julia [name]  Procedure(s) Performed: LEFT BREAST LUMPECTOMY WITH RADIOACTIVE SEED LOCALIZATION (Left: Breast)  Patient Location: PACU  Anesthesia Type:General  Level of Consciousness: awake, drowsy and patient cooperative  Airway & Oxygen Therapy: Patient Spontanous Breathing and Patient connected to face mask oxygen  Post-op Assessment: Report given to RN and Post -op Vital signs reviewed and stable  Post vital signs: Reviewed and stable  Last Vitals:  Vitals Value Taken Time  BP    Temp    Pulse    Resp    SpO2      Last Pain:  Vitals:   05/21/21 0741  TempSrc: Oral  PainSc: 0-No pain      Patients Stated Pain Goal: 5 (25/05/39 7673)  Complications: No notable events documented.

## 2021-05-21 NOTE — Discharge Instructions (Addendum)
°  Post Anesthesia Home Care Instructions  Activity: Get plenty of rest for the remainder of the day. A responsible individual must stay with you for 24 hours following the procedure.  For the next 24 hours, DO NOT: -Drive a car -Paediatric nurse -Drink alcoholic beverages -Take any medication unless instructed by your physician -Make any legal decisions or sign important papers.  Meals: Start with liquid foods such as gelatin or soup. Progress to regular foods as tolerated. Avoid greasy, spicy, heavy foods. If nausea and/or vomiting occur, drink only clear liquids until the nausea and/or vomiting subsides. Call your physician if vomiting continues.  Special Instructions/Symptoms: Your throat may feel dry or sore from the anesthesia or the breathing tube placed in your throat during surgery. If this causes discomfort, gargle with warm salt water. The discomfort should disappear within 24 hours.  If you had a scopolamine patch placed behind your ear for the management of post- operative nausea and/or vomiting:  1. The medication in the patch is effective for 72 hours, after which it should be removed.  Wrap patch in a tissue and discard in the trash. Wash hands thoroughly with soap and water. 2. You may remove the patch earlier than 72 hours if you experience unpleasant side effects which may include dry mouth, dizziness or visual disturbances. 3. Avoid touching the patch. Wash your hands with soap and water after contact with the patch. Next tylenol can be given at 2pm. Next NSAID (motrin or ibuprofen) 4pm.

## 2021-05-21 NOTE — H&P (Signed)
REFERRING PHYSICIAN: Allyson Sabal, *  PROVIDER: Landry Corporal, MD  MRN: [id number] DOB: 1965-06-15 Subjective  Chief Complaint: Left breast complex   History of Present Illness: Julia [name] is a 56 y.o. female who is seen today as an office consultation at the request of Dr. Luan Pulling for evaluation of Left breast complex .   We are asked to see the patient in consultation by Dr. Emmit Pomfret to evaluate her for a complex sclerosing lesion of the left breast. The patient is a 56 year old white female who recently went for a routine screening mammogram. At that time she was found to have an 8 mm area of distortion in the upper outer quadrant of the left breast. This was biopsied and came back as a complex sclerosing lesion. She has no family history of breast cancer. She did have a grandfather with lung cancer and an aunt with Hodgkin's lymphoma  Review of Systems: A complete review of systems was obtained from the patient. I have reviewed this information and discussed as appropriate with the patient. See HPI as well for other ROS.  ROS   Medical History: Past Medical History:  Diagnosis Date   Abdominal bloating 03/07/2018   Allergic rhinitis   Allergy  Seasonal   Cervical cancer (CMS-HCC)   Chronic insomnia  recurrent, previously on Ambien, with patient expressing concern about being addicted to this. Rozerem was ineffective.   Depression   Depression with anxiety  followed by Dr. Charlott Holler at [facility], improved with Lithium, Effexor.   DOE (dyspnea on exertion)  stress test negative for ischemia   Elevated liver enzymes 10/22/2018   Fatty liver  found on abdominal ultrasound 09/15/12.   Gastric erosions 10/22/2018   Gastrointestinal ulcer   GERD (gastroesophageal reflux disease) 2013  diagnosed by ENT   History of abnormal cervical Pap smear  Had cervical cancer   Hx of adenomatous colonic polyps 03/07/2018   Hyperlipidemia   Hypertension    Leg edema 2006  Echo with normal LV function. Doppler negative for DVT.   Migraines  evaluated by [place]   Obesity   Osteoarthritis 2014   Other dysphagia 07/03/2018   Overactive bladder  improved with Enablex. S/p bladder tack.   Rectal discharge 03/07/2018   Right lower quadrant abdominal pain   Vitamin D deficiency   Patient Active Problem List  Diagnosis   Allergic rhinitis   GERD (gastroesophageal reflux disease)   Depression with anxiety   Leg edema   Chronic insomnia   Hyperlipidemia   Overactive bladder   Vitamin D deficiency   Migraines   Fatty liver   Hypertension   Elevated rheumatoid factor   Polyarthralgia   Status post left partial knee replacement   Chronic pansinusitis   Hx of adenomatous colonic polyps   Elevated liver enzymes   Gastric erosions   Barrett's esophagus without dysplasia   DDD (degenerative disc disease), cervical   Cervical facet syndrome   Primary osteoarthritis of right knee   Primary osteoarthritis of left knee   Neck pain   Upper back pain   Myofascial pain   Cervical radiculitis   Complex sclerosing lesion of left breast   Past Surgical History:  Procedure Laterality Date   APPENDECTOMY 2002   ARTHROPLASTY KNEE CONDYLE & PLATEAU MEDIAL/LATERAL Left 01/24/2017  Procedure: ARTHROPLASTY, KNEE, CONDYLE AND PLATEAU; MEDIAL OR LATERAL COMPARTMENT; Surgeon: Mardi Mainland, MD; Location: [place]; Service: Orthopedics; Laterality: Left;   Bladder tack 2012  COLONOSCOPY 12/26/2012  Adenomatous Polyp: CBF 12/2017: Recall ltr mailed 10/16/17 (kj)   COLONOSCOPY 04/03/2018  PH Adenomatous Polyp: CBF 03/2023   COLONOSCOPY 04/23/2020  Normal colon biopsy/PHx CP/Fair colon prep/Repeat 21yrs/CTL   DILATION AND CURETTAGE, DIAGNOSTIC / THERAPEUTIC 2004   EGD 12/26/2012  Gastric Ulcer   EGD 07/31/2018  Esophagitis; Gastritis: CBF 07/2021   HYSTERECTOMY 2010  with ovaries intact, for abnormal Pap smears; focal  area of cervical cancer with clear margins, per pt. Followed by Dr. Ronita Hipps, [place], [place].   JOINT REPLACEMENT 2018   S/P sinus surgery 2001, 2005   SIGMOIDOSCOPY FLEXIBLE W/BIOPSY N/A 09/30/2020  Procedure: FLEXIBLE SIGMOIDOSCOPY; Surgeon: Johnnette Litter, MD; Location: [facility]; Service: Gastroenterology; Laterality: N/A;    Allergies  Allergen Reactions   Celexa [Citalopram] Other (See Comments)  Weight gain   Ketek [Telithromycin] Diarrhea, Nausea and Vomiting   Penicillin V Potassium Rash  Tolerated Ancef 01/24/17   Zyprexa [Olanzapine] Other (See Comments)  Weight gain   Current Outpatient Medications on File Prior to Visit  Medication Sig Dispense Refill   DULoxetine (CYMBALTA) 60 MG DR capsule Take 1 capsule (60 mg total) by mouth 2 (two) times daily 180 capsule 3   multivitamin (MULTIPLE VITAMINS DAILY ORAL) Take by mouth   omeprazole (PRILOSEC) 40 MG DR capsule Take 1 capsule (40 mg total) by mouth once daily 30 capsule 11   predniSONE (DELTASONE) 10 MG tablet 40mg  x 2 days, 30mg  x 2days, 20mg  x 2days, 10mg  x 2days 20 tablet 0   traZODone (DESYREL) 100 MG tablet Take 1 tablet (100 mg total) by mouth at bedtime 90 tablet 3   No current facility-administered medications on file prior to visit.   Family History  Problem Relation Age of Onset   Hepatitis C Mother   Cirrhosis Mother   High blood pressure (Hypertension) Mother   Mental illness Mother   Epilepsy Mother   Diabetes Mother   Hypothyroidism Mother   Alcohol abuse Mother   COPD Mother   Depression Mother   Diabetes type II Mother   Heart disease Mother   Hepatitis Mother   Bipolar disorder Mother   Cancer Paternal Grandfather   Colon polyps Daughter   Diabetes type I Daughter   Hodgkin's lymphoma Paternal Aunt   Hypothyroidism Maternal Grandmother   High blood pressure (Hypertension) Maternal Grandmother   Osteoarthritis Maternal Grandmother   Rheum arthritis Maternal Grandmother   Thyroid  disease Maternal Grandmother   Hepatitis C Other   Diabetes Other   Anesthesia problems Neg Hx    Social History   Tobacco Use  Smoking Status Former   Packs/day: 1.00   Years: 12.00   Pack years: 12.00   Types: Cigarettes   Start date: 04/25/1980   Quit date: 04/25/1992   Years since quitting: 28.9  Smokeless Tobacco Never  Tobacco Comments  quit 1995    Social History   Socioeconomic History   Marital status: Married  Occupational History   Occupation: Passenger transport manager  Tobacco Use   Smoking status: Former  Packs/day: 1.00  Years: 12.00  Pack years: 12.00  Types: Cigarettes  Start date: 04/25/1980  Quit date: 04/25/1992  Years since quitting: 28.9   Smokeless tobacco: Never   Tobacco comments:  quit 1995  Vaping Use   Vaping Use: Never used  Substance and Sexual Activity   Alcohol use: Not Currently  Comment: rare   Drug use: No   Sexual activity: Yes  Partners: Male  Birth control/protection: Post-menopausal  Objective:   Vitals:  BP: 132/84  Pulse: (!) 112  Temp: 36.8 C (98.3 F)  SpO2: 98%  Weight: 92.4 kg (203 lb 9.6 oz)  Height: 162.6 cm (5\' 4" )   Body mass index is 34.95 kg/m.  Physical Exam Vitals reviewed.  Constitutional:  General: She is not in acute distress. Appearance: Normal appearance.  HENT:  Head: Normocephalic and atraumatic.  Right Ear: External ear normal.  Left Ear: External ear normal.  Nose: Nose normal.  Mouth/Throat:  Mouth: Mucous membranes are moist.  Pharynx: Oropharynx is clear.  Eyes:  General: No scleral icterus. Extraocular Movements: Extraocular movements intact.  Conjunctiva/sclera: Conjunctivae normal.  Pupils: Pupils are equal, round, and reactive to light.  Cardiovascular:  Rate and Rhythm: Normal rate and regular rhythm.  Pulses: Normal pulses.  Heart sounds: Normal heart sounds.  Pulmonary:  Effort: Pulmonary effort is normal. No respiratory distress.  Breath sounds: Normal breath sounds.   Abdominal:  General: Bowel sounds are normal.  Palpations: Abdomen is soft.  Tenderness: There is no abdominal tenderness.  Musculoskeletal:  General: No swelling, tenderness or deformity. Normal range of motion.  Cervical back: Normal range of motion and neck supple.  Skin: General: Skin is warm and dry.  Coloration: Skin is not jaundiced.  Neurological:  General: No focal deficit present.  Mental Status: She is alert and oriented to person, place, and time.  Psychiatric:  Mood and Affect: Mood normal.  Behavior: Behavior normal.     Breast: There is no palpable mass in either breast. There is no palpable axillary, supraclavicular, or cervical lymphadenopathy.  Labs, Imaging and Diagnostic Testing:  Assessment and Plan:  Diagnoses and all orders for this visit:  Complex sclerosing lesion of left breast - [facility] Case Posting Request; Future    The patient appears to have an 8 mm area of complex sclerosing lesion in the upper outer quadrant of the left breast. Because of its abnormal appearance and because there is a 5 to 10% chance of missing something more significant I would recommend that this area be removed. She would also like to have this done. I have discussed with her in detail the risks and benefits of the operation as well as some of the technical aspects including the use of a radioactive seed for localization and she understands and wishes to proceed.

## 2021-05-21 NOTE — Anesthesia Procedure Notes (Signed)
Procedure Name: LMA Insertion Date/Time: 05/21/2021 8:23 AM Performed by: Willa Frater, CRNA Pre-anesthesia Checklist: Patient identified, Emergency Drugs available, Suction available and Patient being monitored Patient Re-evaluated:Patient Re-evaluated prior to induction Oxygen Delivery Method: Circle system utilized Preoxygenation: Pre-oxygenation with 100% oxygen Induction Type: IV induction Ventilation: Mask ventilation without difficulty LMA: LMA inserted LMA Size: 4.0 Number of attempts: 1 Airway Equipment and Method: Bite block Placement Confirmation: positive ETCO2 Tube secured with: Tape Dental Injury: Teeth and Oropharynx as per pre-operative assessment

## 2021-05-21 NOTE — Interval H&P Note (Signed)
History and Physical Interval Note:  05/21/2021 8:05 AM  Julia [name]  has presented today for surgery, with the diagnosis of LEFT BREAST COMPLEX SCLEROSING LESION.  The various methods of treatment have been discussed with the patient and family. After consideration of risks, benefits and other options for treatment, the patient has consented to  Procedure(s): LEFT BREAST LUMPECTOMY WITH RADIOACTIVE SEED LOCALIZATION (Left) as a surgical intervention.  The patient's history has been reviewed, patient examined, no change in status, stable for surgery.  I have reviewed the patient's chart and labs.  Questions were answered to the patient's satisfaction.     Autumn Messing III

## 2021-05-21 NOTE — Op Note (Signed)
05/21/2021  9:04 AM  PATIENT:  Julia [name]  56 y.o. female  PRE-OPERATIVE DIAGNOSIS:  LEFT BREAST COMPLEX SCLEROSING LESION  POST-OPERATIVE DIAGNOSIS:  LEFT BREAST COMPLEX SCLEROSING LESION  PROCEDURE:  Procedure(s): LEFT BREAST LUMPECTOMY WITH RADIOACTIVE SEED LOCALIZATION (Left)  SURGEON:  Surgeon(s) and Role:    Jovita Kussmaul, MD - Primary  PHYSICIAN ASSISTANT:   ASSISTANTS: none   ANESTHESIA:   local and general  EBL:  5 mL   BLOOD ADMINISTERED:none  DRAINS: none   LOCAL MEDICATIONS USED:  MARCAINE     SPECIMEN:  Source of Specimen:  left breast tissue  DISPOSITION OF SPECIMEN:  PATHOLOGY  COUNTS:  YES  TOURNIQUET:  * No tourniquets in log *  DICTATION: .Dragon Dictation  After informed consent was obtained the patient was brought to the operating room and placed in the supine position on the operating table.  After adequate induction of general anesthesia the patient's left breast was prepped with ChloraPrep, allowed to dry, and draped in usual sterile manner.  An appropriate timeout was performed.  Previously an I-125 seed was placed in the upper outer quadrant of the left breast to mark an area of complex sclerosing lesion.  The neoprobe was set to I-125 in the area of radioactivity was readily identified.  The area around this was infiltrated with quarter percent Marcaine.  A curvilinear incision was made along the outer aspect of the areola of the left breast with a 15 blade knife.  The incision was carried through the skin and subcutaneous tissue sharply with the electrocautery.  Dissection was then carried towards the radioactive seed under the direction of the neoprobe.  Once I more closely approached the radioactive seed I then removed a circular portion of breast tissue sharply with the electrocautery around the radioactive seed while checking the area of radioactivity frequently.  Once the specimen was removed it was oriented with the appropriate paint  colors.  A specimen radiograph was obtained that showed the clip and seed to be within the specimen.  Specimen was then sent to pathology for further evaluation.  Hemostasis was achieved using the Bovie electrocautery.  The wound was irrigated with saline and infiltrated with more quarter percent Marcaine.  The deep layer of the wound was closed with layers of interrupted 3-0 Vicryl stitches.  The skin was then closed with interrupted 4-0 Monocryl subcuticular stitches.  Dermabond dressings were applied.  The patient tolerated the procedure well.  At the end of the case all needle sponge and instrument counts were correct.  The patient was then awakened and taken to recovery in stable condition.  PLAN OF CARE: Discharge to home after PACU  PATIENT DISPOSITION:  PACU - hemodynamically stable.   Delay start of Pharmacological VTE agent (>24hrs) due to surgical blood loss or risk of bleeding: not applicable

## 2021-05-21 NOTE — Anesthesia Postprocedure Evaluation (Signed)
Anesthesia Post Note  Patient: Julia [name]  Procedure(s) Performed: LEFT BREAST LUMPECTOMY WITH RADIOACTIVE SEED LOCALIZATION (Left: Breast)     Patient location during evaluation: PACU Anesthesia Type: General Level of consciousness: sedated and patient cooperative Pain management: pain level controlled Vital Signs Assessment: post-procedure vital signs reviewed and stable Respiratory status: spontaneous breathing Cardiovascular status: stable Anesthetic complications: no   No notable events documented.  Last Vitals:  Vitals:   05/21/21 0945 05/21/21 0957  BP: 132/74 139/83  Pulse: (!) 103 98  Resp: 16 16  Temp:  37.1 C  SpO2: 94% 98%    Last Pain:  Vitals:   05/21/21 0914  TempSrc:   PainSc: 0-No pain                 Nolon Nations

## 2021-05-24 ENCOUNTER — Encounter (HOSPITAL_BASED_OUTPATIENT_CLINIC_OR_DEPARTMENT_OTHER): Payer: Self-pay | Admitting: General Surgery

## 2021-05-24 LAB — SURGICAL PATHOLOGY

## 2021-07-01 ENCOUNTER — Encounter (HOSPITAL_COMMUNITY): Payer: Self-pay

## 2021-07-08 IMAGING — CT CT PELVIS W/ CM
1 series · 15 of 32 positions shown, 19 images · IV contrast (APPLIED)
Comparison: None available.

CLINICAL DATA: Pelvic pressure, bladder pressure and rectal
bleeding for several weeks.

EXAM:
CT PELVIS WITH CONTRAST
TECHNIQUE: Multidetector CT imaging of the pelvis was performed using the
standard protocol following the bolus administration of intravenous
contrast.
CONTRAST:  100mL OMNIPAQUE IOHEXOL 300 MG/ML  SOLN

[Series 2: axial st · axial · 0.98mm/px · z∈[-950,-675]mm · 15 of 61 slices shown, 19 images]
[im 4/61  soft-tissue]
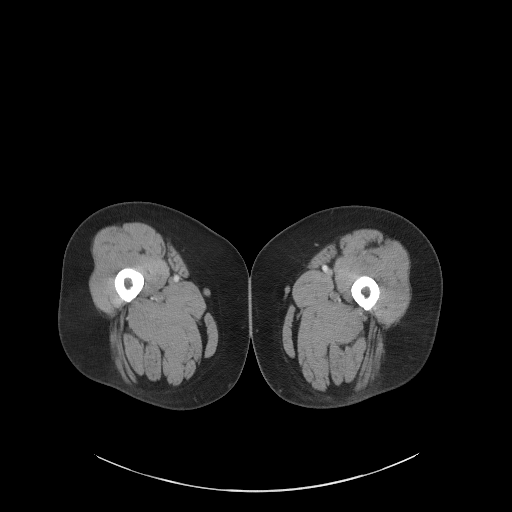
[im 4/61  bone]
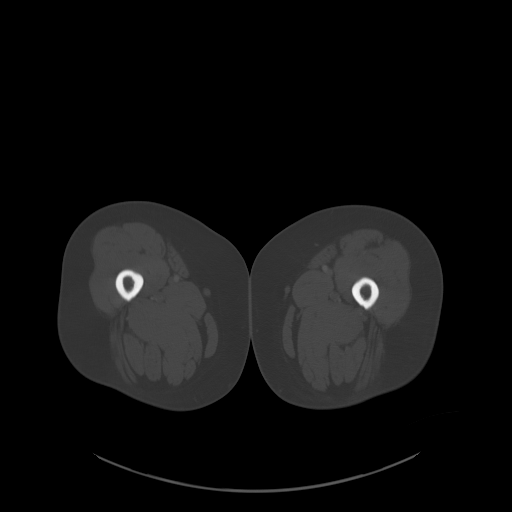
[im 8/61  soft-tissue]
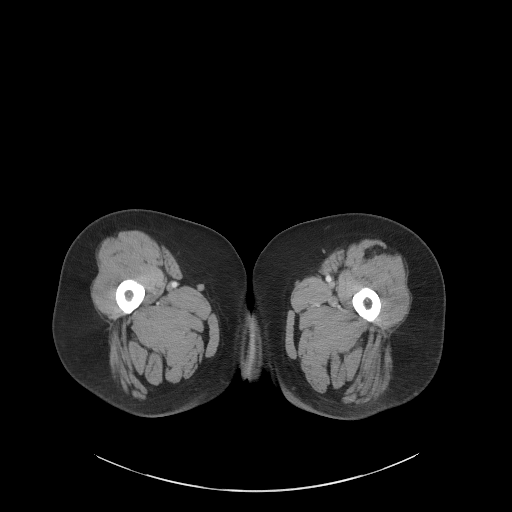
[im 12/61  soft-tissue]
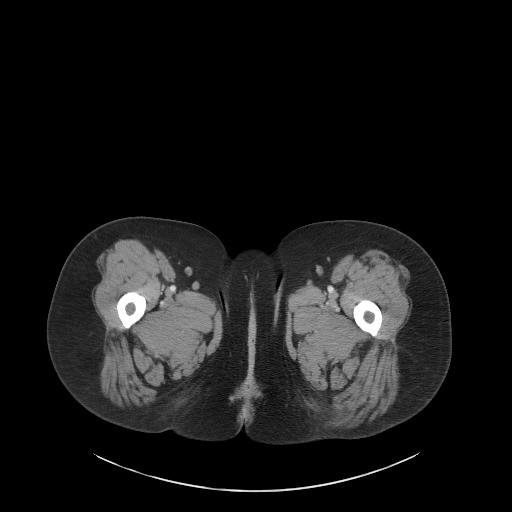
[im 18/61  soft-tissue]
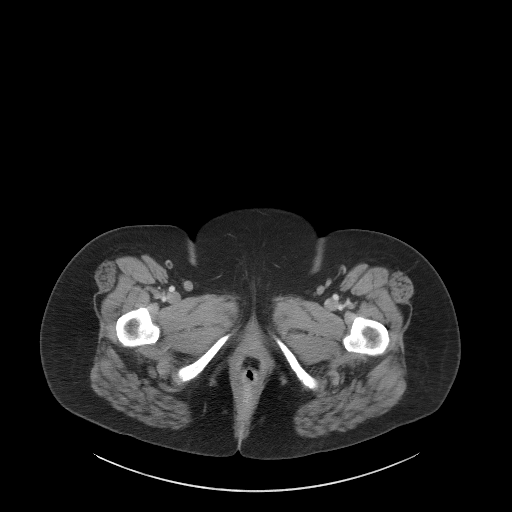
[im 22/61  soft-tissue]
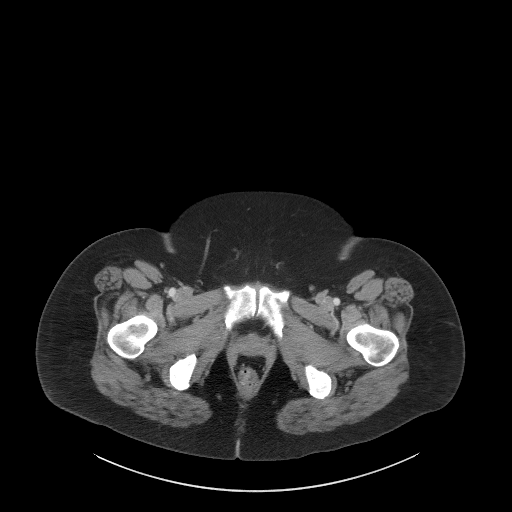
[im 26/61  soft-tissue]
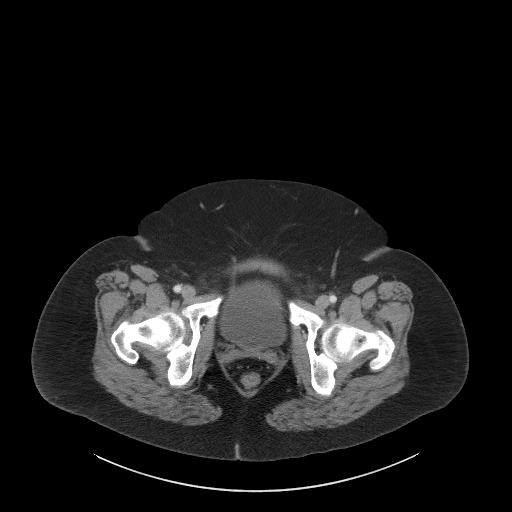
[im 31/61  soft-tissue]
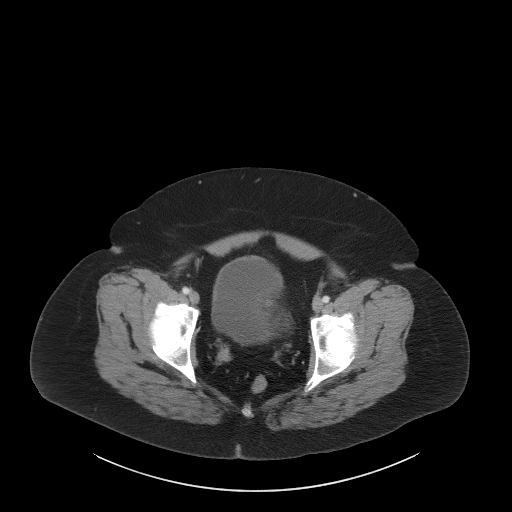
[im 35/61  soft-tissue]
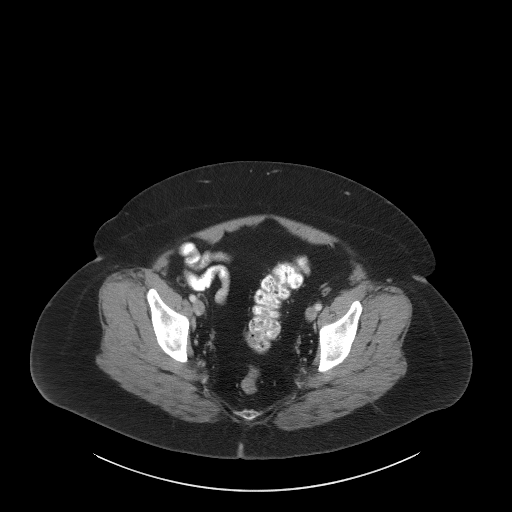
[im 39/61  soft-tissue]
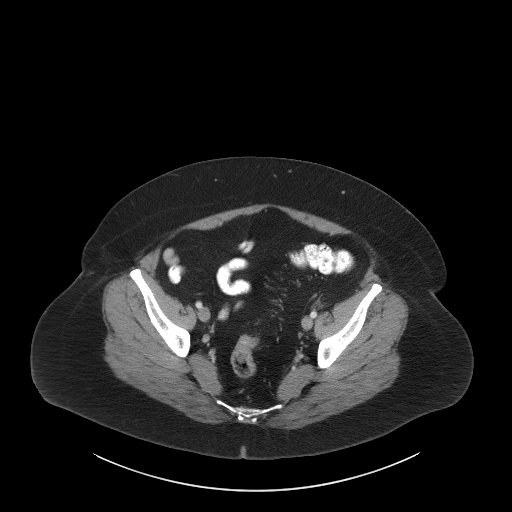
[im 39/61  bone]
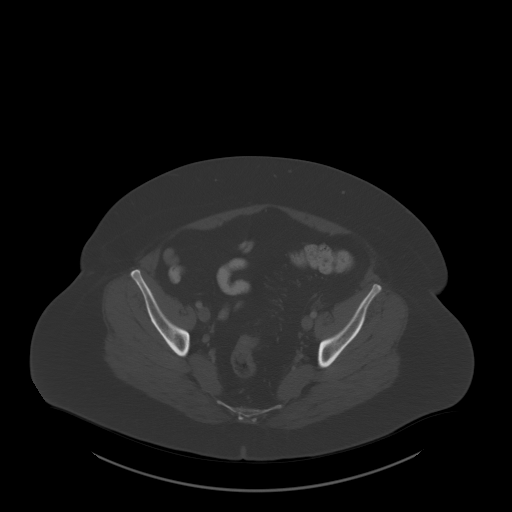
[im 43/61  soft-tissue]
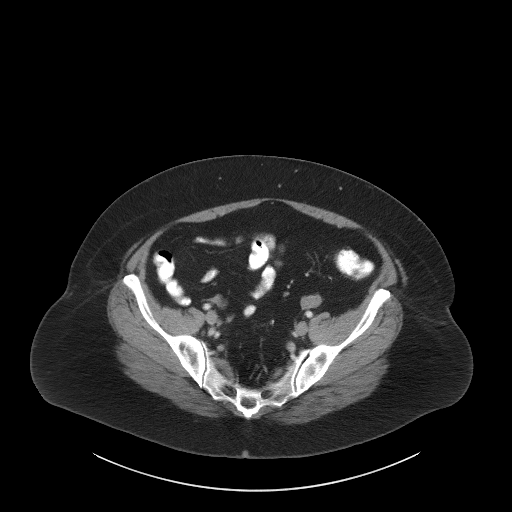
[im 49/61  soft-tissue]
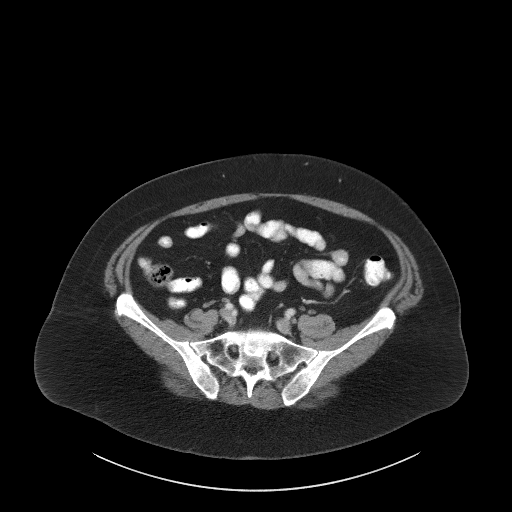
[im 53/61  soft-tissue]
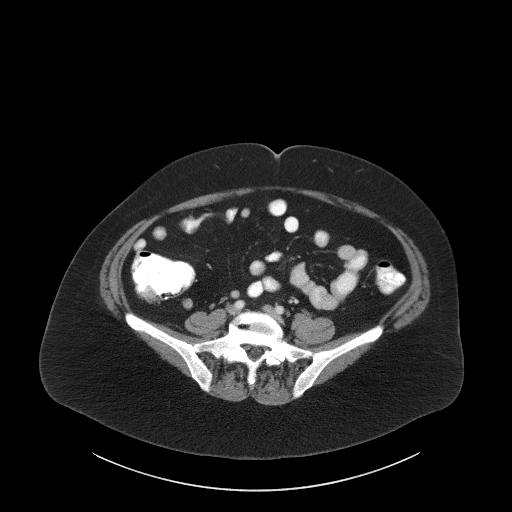
[im 53/61  lung]
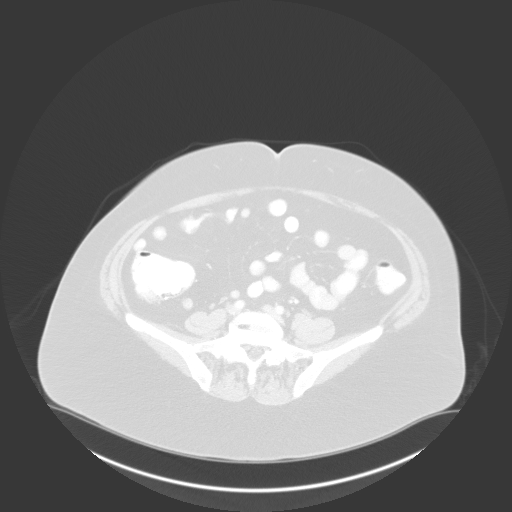
[im 55/61  lung]
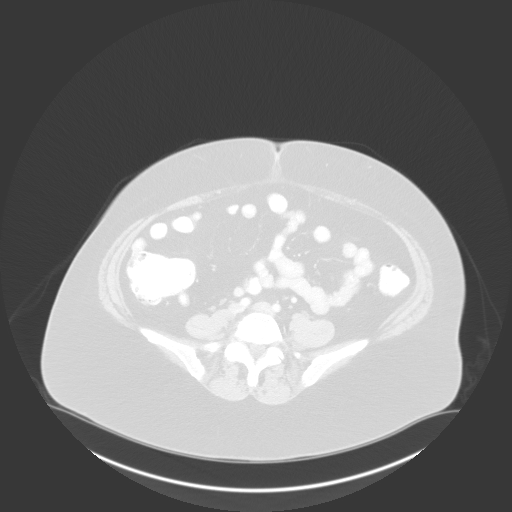
[im 57/61  soft-tissue]
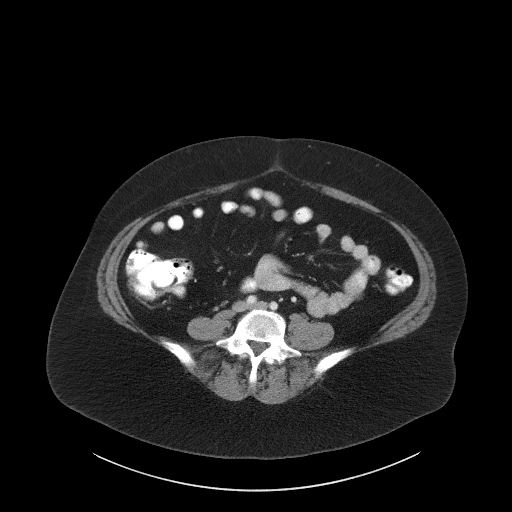
[im 57/61  lung]
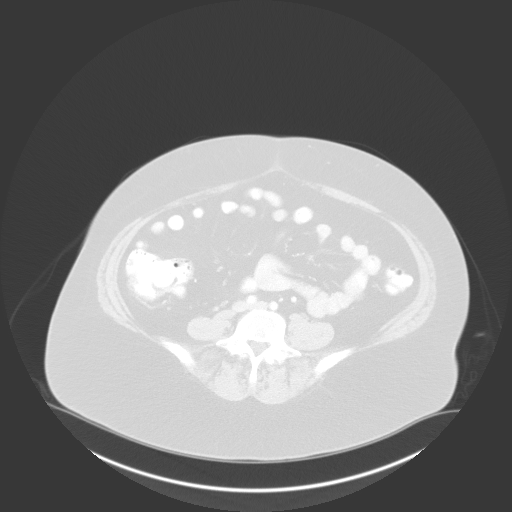
[im 59/61  lung]
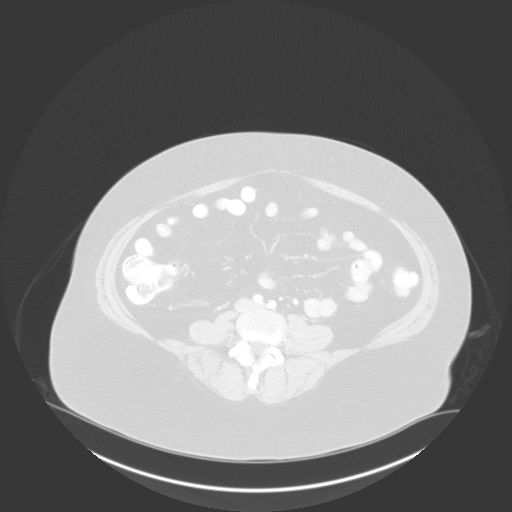

[15 of 32 positions shown; findings below may reference images not displayed]

FINDINGS: Urinary Tract: The bladder is unremarkable. No bladder mass or
calculi.

Bowel: The rectum, sigmoid colon and visualized small bowel loops
are unremarkable. The cecum and terminal ileum appear normal.

Vascular/Lymphatic: The iliac arteries demonstrate minimal
calcifications. No aneurysm or dissection. The major venous
structures appear normal. No pelvic or inguinal lymphadenopathy.

Reproductive: The uterus is surgically absent. Both ovaries are
still present and appear normal.

Other: No free pelvic fluid collections, pelvic mass or inguinal
hernia.

Musculoskeletal: No significant bony findings.
IMPRESSION: 1. Unremarkable CT examination of the pelvis. No acute inflammatory
process, mass lesions or adenopathy.
2. Status post hysterectomy. Both ovaries are still present and
appear normal.

## 2021-09-21 DIAGNOSIS — R7303 Prediabetes: Secondary | ICD-10-CM | POA: Insufficient documentation

## 2021-11-06 DIAGNOSIS — S83012A Lateral subluxation of left patella, initial encounter: Secondary | ICD-10-CM | POA: Insufficient documentation

## 2022-06-06 ENCOUNTER — Ambulatory Visit: Payer: BC Managed Care – PPO

## 2022-06-06 DIAGNOSIS — K2100 Gastro-esophageal reflux disease with esophagitis, without bleeding: Secondary | ICD-10-CM | POA: Diagnosis not present

## 2022-06-06 DIAGNOSIS — R1310 Dysphagia, unspecified: Secondary | ICD-10-CM

## 2022-09-17 IMAGING — US US ABDOMEN LIMITED
1 series · 15 of 25 positions shown · non-contrast
Comparison: US Abdomen, 07/12/2018 and 09/11/2012. CT AP,
10/23/2012.

CLINICAL DATA: Elevated liver enzymes.

EXAM:
ULTRASOUND ABDOMEN LIMITED RIGHT UPPER QUADRANT

[Series 1: us abdomen limited ruq · 15 of 46 slices shown]
[im 1/46]
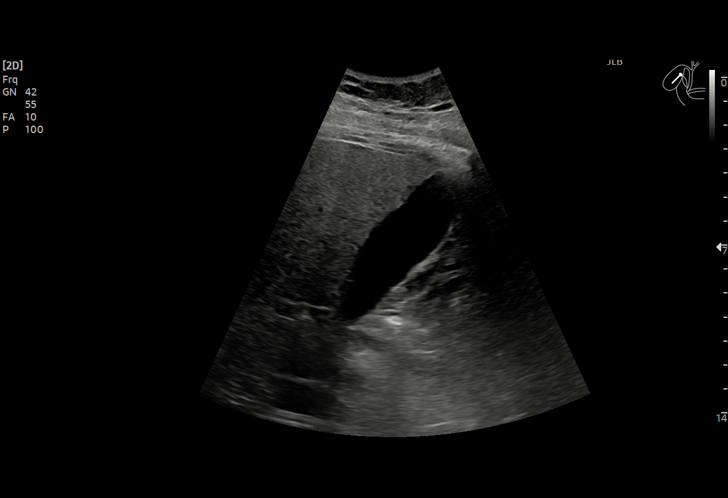
[im 4/46]
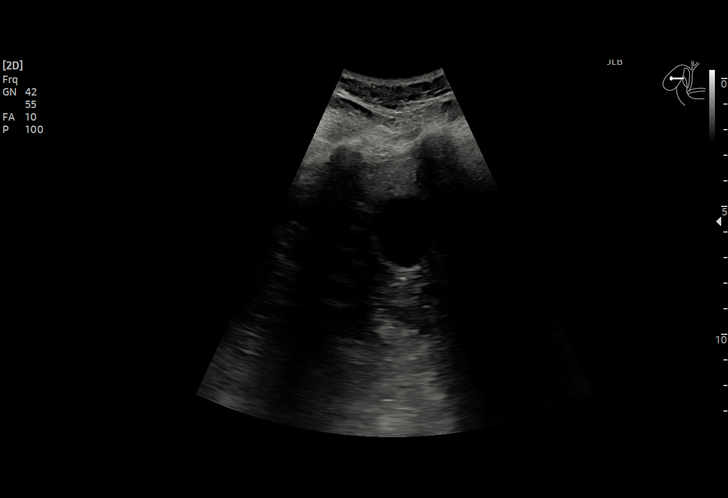
[im 8/46]
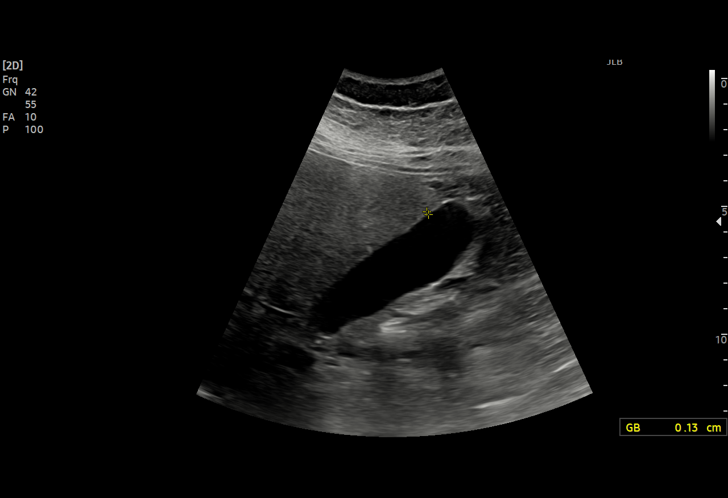
[im 10/46]
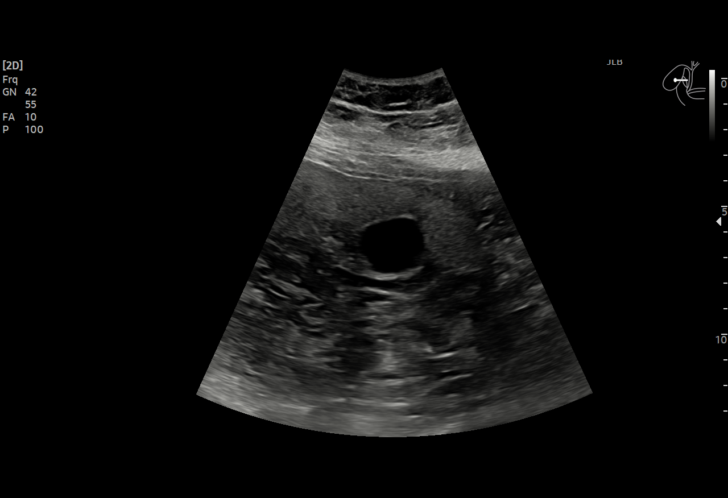
[im 14/46]
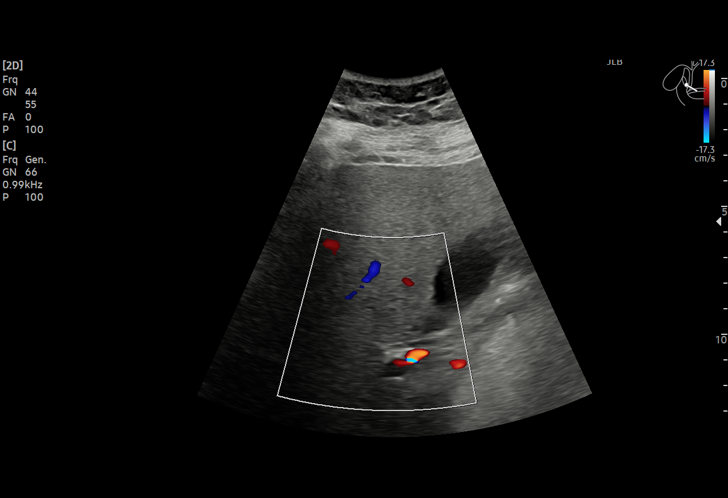
[im 17/46]
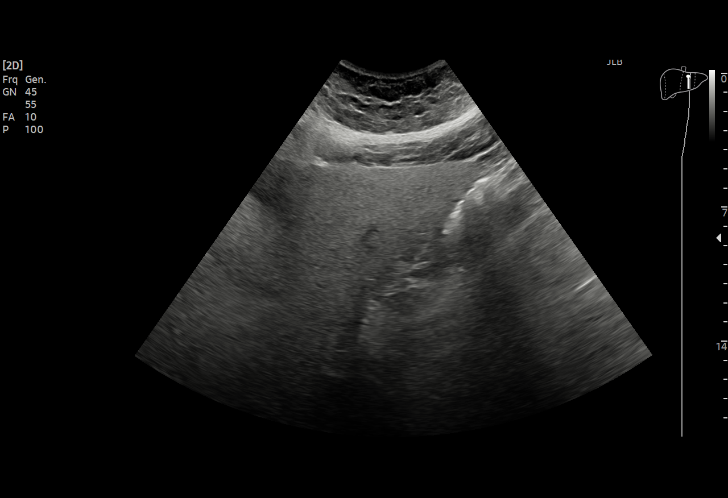
[im 19/46]
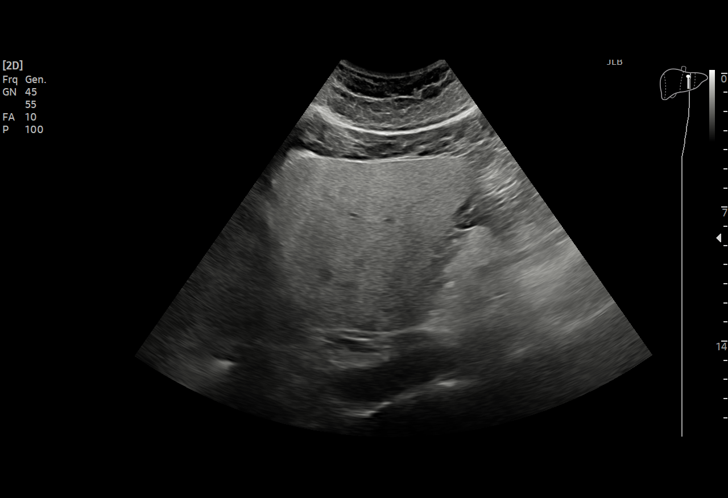
[im 23/46]
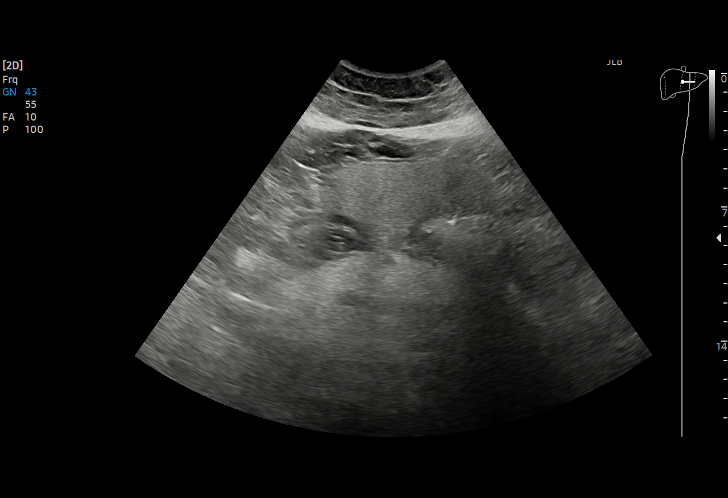
[im 27/46]
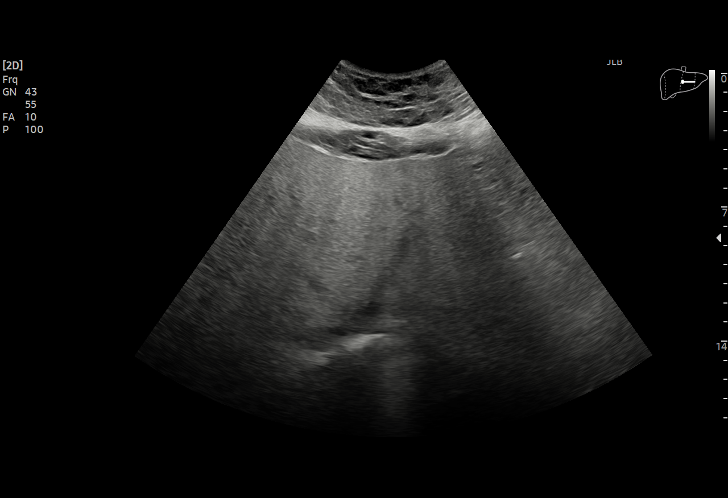
[im 29/46]
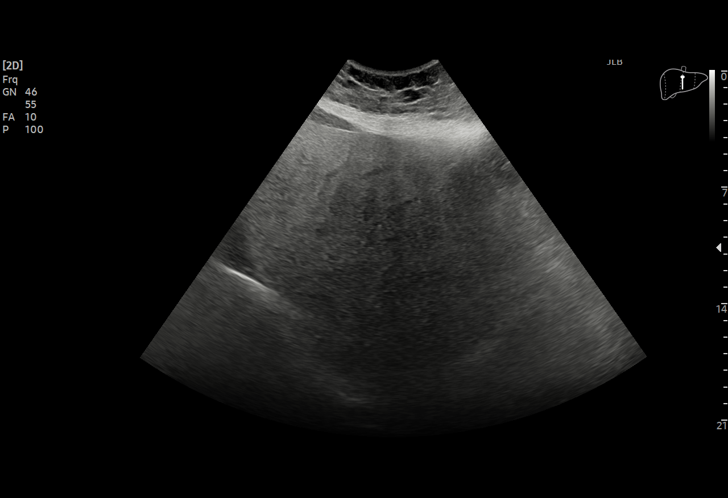
[im 32/46]
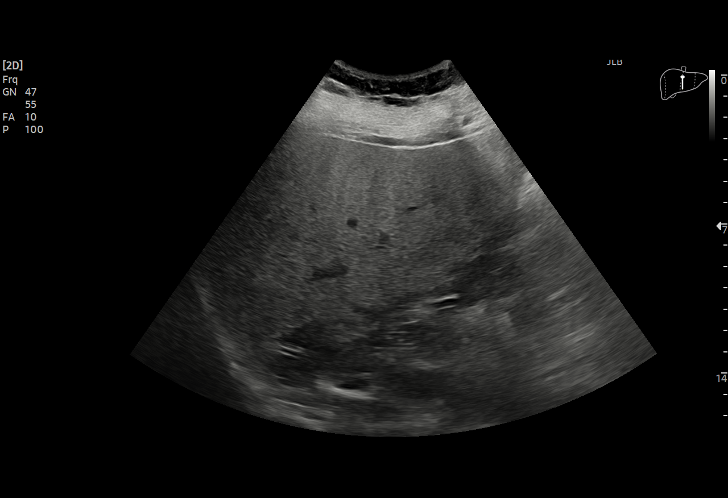
[im 36/46]
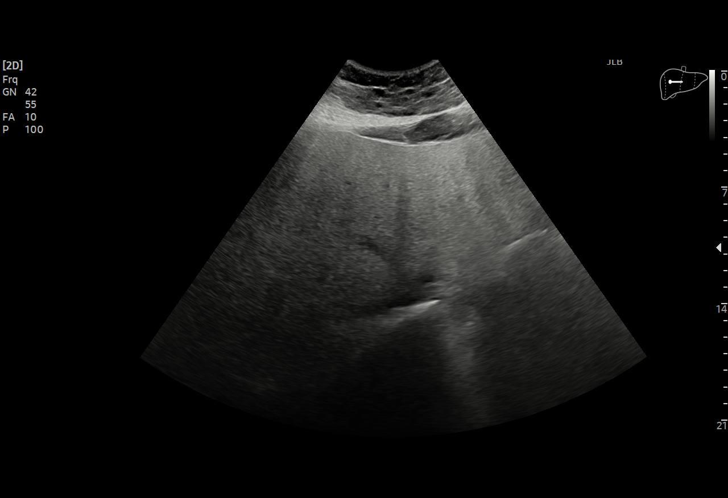
[im 38/46]
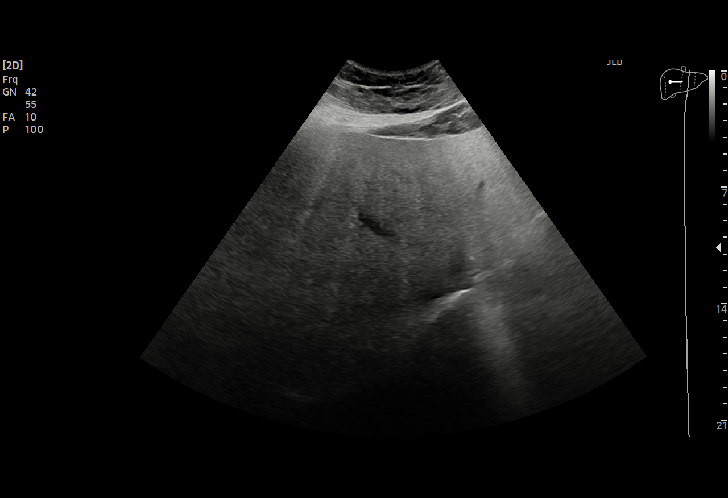
[im 42/46]
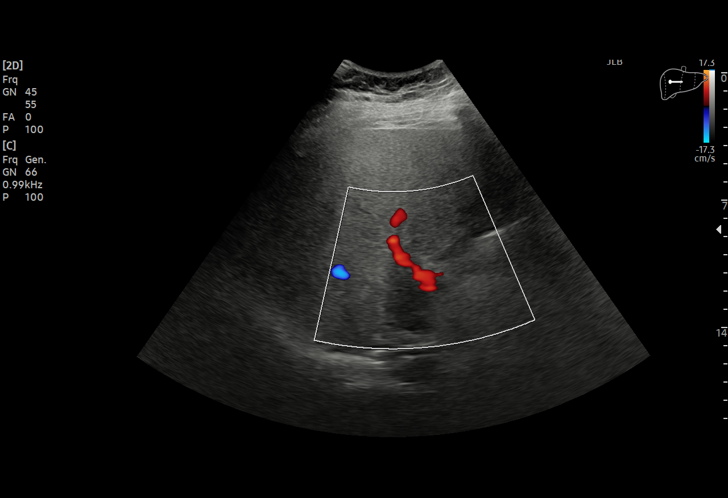
[im 46/46]
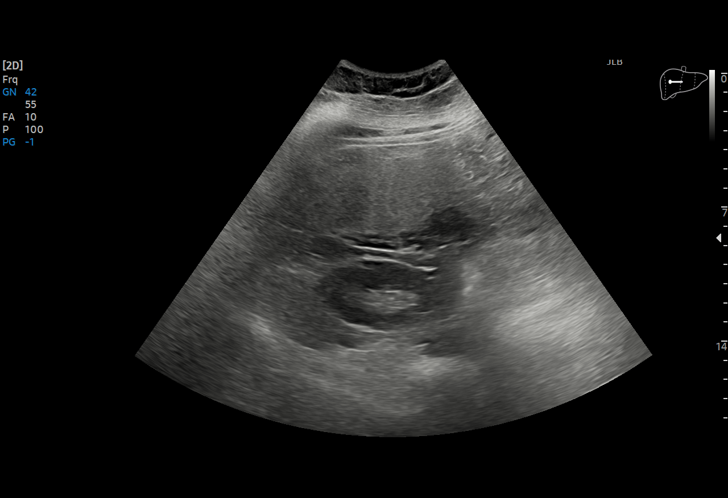

[15 of 25 positions shown; findings below may reference images not displayed]

FINDINGS: Suboptimal evaluation, with poor acoustic penetration secondary to
patient habitus.

Gallbladder:

No gallstones or wall thickening visualized. No sonographic Murphy
sign noted by sonographer.

Common bile duct:

Diameter: 0.2 cm

Liver:

No focal lesion identified. Increased hepatic parenchymal
echogenicity. Portal vein is patent on color Doppler imaging with
normal direction of blood flow towards the liver.

Other: No perihepatic ascites.
IMPRESSION: 1. No acute sonographic findings within the RIGHT upper quadrant.
2. Echogenic liver. Findings most commonly seen in hepatic
steatosis, though may also represent hepatitis and/or fibrosis.

## 2023-06-30 ENCOUNTER — Ambulatory Visit: Payer: BC Managed Care – PPO | Admitting: Physical Therapy

## 2023-07-07 ENCOUNTER — Ambulatory Visit: Payer: BC Managed Care – PPO | Admitting: Physical Therapy

## 2023-07-21 ENCOUNTER — Encounter: Payer: BC Managed Care – PPO | Admitting: Physical Therapy

## 2023-07-28 ENCOUNTER — Encounter: Payer: BC Managed Care – PPO | Admitting: Physical Therapy

## 2023-08-04 ENCOUNTER — Encounter: Payer: BC Managed Care – PPO | Admitting: Physical Therapy

## 2023-09-29 DIAGNOSIS — M461 Sacroiliitis, not elsewhere classified: Secondary | ICD-10-CM | POA: Insufficient documentation

## 2023-11-27 ENCOUNTER — Ambulatory Visit: Admitting: Dermatology

## 2023-11-27 ENCOUNTER — Encounter: Payer: Self-pay | Admitting: Dermatology

## 2023-11-27 DIAGNOSIS — D492 Neoplasm of unspecified behavior of bone, soft tissue, and skin: Secondary | ICD-10-CM

## 2023-11-27 DIAGNOSIS — W908XXA Exposure to other nonionizing radiation, initial encounter: Secondary | ICD-10-CM

## 2023-11-27 DIAGNOSIS — R600 Localized edema: Secondary | ICD-10-CM | POA: Insufficient documentation

## 2023-11-27 DIAGNOSIS — F418 Other specified anxiety disorders: Secondary | ICD-10-CM | POA: Insufficient documentation

## 2023-11-27 DIAGNOSIS — E559 Vitamin D deficiency, unspecified: Secondary | ICD-10-CM | POA: Insufficient documentation

## 2023-11-27 DIAGNOSIS — D485 Neoplasm of uncertain behavior of skin: Secondary | ICD-10-CM

## 2023-11-27 DIAGNOSIS — D229 Melanocytic nevi, unspecified: Secondary | ICD-10-CM

## 2023-11-27 DIAGNOSIS — Z1283 Encounter for screening for malignant neoplasm of skin: Secondary | ICD-10-CM | POA: Diagnosis not present

## 2023-11-27 DIAGNOSIS — L989 Disorder of the skin and subcutaneous tissue, unspecified: Secondary | ICD-10-CM

## 2023-11-27 DIAGNOSIS — L578 Other skin changes due to chronic exposure to nonionizing radiation: Secondary | ICD-10-CM

## 2023-11-27 DIAGNOSIS — L821 Other seborrheic keratosis: Secondary | ICD-10-CM

## 2023-11-27 DIAGNOSIS — E785 Hyperlipidemia, unspecified: Secondary | ICD-10-CM | POA: Insufficient documentation

## 2023-11-27 DIAGNOSIS — G43909 Migraine, unspecified, not intractable, without status migrainosus: Secondary | ICD-10-CM | POA: Insufficient documentation

## 2023-11-27 DIAGNOSIS — D225 Melanocytic nevi of trunk: Secondary | ICD-10-CM

## 2023-11-27 DIAGNOSIS — F5104 Psychophysiologic insomnia: Secondary | ICD-10-CM | POA: Insufficient documentation

## 2023-11-27 DIAGNOSIS — K219 Gastro-esophageal reflux disease without esophagitis: Secondary | ICD-10-CM | POA: Insufficient documentation

## 2023-11-27 DIAGNOSIS — L814 Other melanin hyperpigmentation: Secondary | ICD-10-CM

## 2023-11-27 DIAGNOSIS — K760 Fatty (change of) liver, not elsewhere classified: Secondary | ICD-10-CM | POA: Insufficient documentation

## 2023-11-27 DIAGNOSIS — N3281 Overactive bladder: Secondary | ICD-10-CM | POA: Insufficient documentation

## 2023-11-27 DIAGNOSIS — D1801 Hemangioma of skin and subcutaneous tissue: Secondary | ICD-10-CM

## 2023-11-27 NOTE — Patient Instructions (Addendum)
 Recommend daily broad spectrum sunscreen SPF 30+ to sun-exposed areas, reapply every 2 hours as needed. Call for new or changing lesions.  Staying in the shade or wearing long sleeves, sun glasses (UVA+UVB protection) and wide brim hats (4-inch brim around the entire circumference of the hat) are also recommended for sun protection.    Wound Care Instructions  Cleanse wound gently with soap and water once a day then pat dry with clean gauze. Apply a thin coat of Petrolatum (petroleum jelly, Vaseline) over the wound (unless you have an allergy to this). We recommend that you use a new, sterile tube of Vaseline. Do not pick or remove scabs. Do not remove the yellow or white healing tissue from the base of the wound.  Cover the wound with fresh, clean, nonstick gauze and secure with paper tape. You may use Band-Aids in place of gauze and tape if the wound is small enough, but would recommend trimming much of the tape off as there is often too much. Sometimes Band-Aids can irritate the skin.  You should call the office for your biopsy report after 1 week if you have not already been contacted.  If you experience any problems, such as abnormal amounts of bleeding, swelling, significant bruising, significant pain, or evidence of infection, please call the office immediately.  FOR ADULT SURGERY PATIENTS: If you need something for pain relief you may take 1 extra strength Tylenol  (acetaminophen ) AND 2 Ibuprofen (200mg  each) together every 4 hours as needed for pain. (do not take these if you are allergic to them or if you have a reason you should not take them.) Typically, you may only need pain medication for 1 to 3 days.        Melanoma ABCDEs  Melanoma is the most dangerous type of skin cancer, and is the leading cause of death from skin disease.  You are more likely to develop melanoma if you: Have light-colored skin, light-colored eyes, or red or blond hair Spend a lot of time in the  sun Tan regularly, either outdoors or in a tanning bed Have had blistering sunburns, especially during childhood Have a close family member who has had a melanoma Have atypical moles or large birthmarks  Early detection of melanoma is key since treatment is typically straightforward and cure rates are extremely high if we catch it early.   The first sign of melanoma is often a change in a mole or a new dark spot.  The ABCDE system is a way of remembering the signs of melanoma.  A for asymmetry:  The two halves do not match. B for border:  The edges of the growth are irregular. C for color:  A mixture of colors are present instead of an even brown color. D for diameter:  Melanomas are usually (but not always) greater than 6mm - the size of a pencil eraser. E for evolution:  The spot keeps changing in size, shape, and color.  Please check your skin once per month between visits. You can use a small mirror in front and a large mirror behind you to keep an eye on the back side or your body.   If you see any new or changing lesions before your next follow-up, please call to schedule a visit.  Please continue daily skin protection including broad spectrum sunscreen SPF 30+ to sun-exposed areas, reapplying every 2 hours as needed when you're outdoors.   Staying in the shade or wearing long sleeves, sun glasses (UVA+UVB protection)  and wide brim hats (4-inch brim around the entire circumference of the hat) are also recommended for sun protection.      Due to recent changes in healthcare laws, you may see results of your pathology and/or laboratory studies on MyChart before the doctors have had a chance to review them. We understand that in some cases there may be results that are confusing or concerning to you. Please understand that not all results are received at the same time and often the doctors may need to interpret multiple results in order to provide you with the best plan of care or course  of treatment. Therefore, we ask that you please give us  2 business days to thoroughly review all your results before contacting the office for clarification. Should we see a critical lab result, you will be contacted sooner.   If You Need Anything After Your Visit  If you have any questions or concerns for your doctor, please call our main line at [phone] and press option 4 to reach your doctor's medical assistant. If no one answers, please leave a voicemail as directed and we will return your call as soon as possible. Messages left after 4 pm will be answered the following business day.   You may also send us  a message via MyChart. We typically respond to MyChart messages within 1-2 business days.  For prescription refills, please ask your pharmacy to contact our office. Our fax number is [phone].  If you have an urgent issue when the clinic is closed that cannot wait until the next business day, you can page your doctor at the number below.    Please note that while we do our best to be available for urgent issues outside of office hours, we are not available 24/7.   If you have an urgent issue and are unable to reach us , you may choose to seek medical care at your doctor's office, retail clinic, urgent care center, or emergency room.  If you have a medical emergency, please immediately call 911 or go to the emergency department.  Pager Numbers  - Dr. Hester: [phone]  - Dr. Jackquline: [phone]  - Dr. Claudene: [phone]   In the event of inclement weather, please call our main line at [phone] for an update on the status of any delays or closures.  Dermatology Medication Tips: Please keep the boxes that topical medications come in in order to help keep track of the instructions about where and how to use these. Pharmacies typically print the medication instructions only on the boxes and not directly on the medication tubes.   If your medication is too  expensive, please contact our office at [phone] option 4 or send us  a message through MyChart.   We are unable to tell what your co-pay for medications will be in advance as this is different depending on your insurance coverage. However, we may be able to find a substitute medication at lower cost or fill out paperwork to get insurance to cover a needed medication.   If a prior authorization is required to get your medication covered by your insurance company, please allow us  1-2 business days to complete this process.  Drug prices often vary depending on where the prescription is filled and some pharmacies may offer cheaper prices.  The website www.goodrx.com contains coupons for medications through different pharmacies. The prices here do not account for what the cost may be with help from insurance (it may be cheaper with your insurance), but  the website can give you the price if you did not use any insurance.  - You can print the associated coupon and take it with your prescription to the pharmacy.  - You may also stop by our office during regular business hours and pick up a GoodRx coupon card.  - If you need your prescription sent electronically to a different pharmacy, notify our office through [facility] or by phone at [phone] option 4.     Si Usted Necesita Algo Despus de Su Visita  Tambin puede enviarnos un mensaje a travs de Clinical cytogeneticist. Por lo general respondemos a los mensajes de MyChart en el transcurso de 1 a 2 das hbiles.  Para renovar recetas, por favor pida a su farmacia que se ponga en contacto con nuestra oficina. Randi lakes de fax es [place] [phone].  Si tiene un asunto urgente cuando la clnica est cerrada y que no puede esperar hasta el siguiente da hbil, puede llamar/localizar a su doctor(a) al nmero que aparece a continuacin.   Por favor, tenga en cuenta que aunque hacemos todo lo posible para estar disponibles para asuntos urgentes fuera  del horario de [place], no estamos disponibles las 24 horas del da, los 7 [address]  [address] de la [place].   Si tiene un problema urgente y no puede comunicarse con nosotros, puede optar por buscar atencin mdica  en el consultorio de su doctor(a), en una clnica privada, en un centro de atencin urgente o en una sala de emergencias.  Si tiene Engineer, drilling, por favor llame inmediatamente al 911 o vaya a la sala de emergencias.  Nmeros de bper  - Dr. Hester: [phone]  - Dra. Jackquline: [phone]  - Dr. Claudene: [phone]   En caso de inclemencias del tiempo, por favor llame a landry capes principal al [phone] para una actualizacin sobre el [place] de cualquier retraso o cierre.  Consejos para la medicacin en dermatologa: Por favor, guarde las cajas en las que vienen los medicamentos de uso tpico para ayudarle a seguir las instrucciones sobre dnde y cmo usarlos. Las farmacias generalmente imprimen las instrucciones del medicamento slo en las cajas y no directamente en los tubos del [place].   Si su medicamento es muy caro, por favor, pngase en contacto con landry rieger llamando al [phone] y presione la opcin 4 o envenos un mensaje a travs de Clinical cytogeneticist.   No podemos decirle cul ser su copago por los medicamentos por adelantado ya que esto es diferente dependiendo de la cobertura de su seguro. Sin embargo, es posible que podamos encontrar un medicamento sustituto a Audiological scientist un formulario para que el seguro cubra el medicamento que se considera necesario.   Si se requiere una autorizacin previa para que su compaa de seguros malta su medicamento, por favor permtanos de 1 a 2 das hbiles para completar este proceso.  Los precios de los medicamentos varan con frecuencia dependiendo del Environmental consultant de dnde se surte la receta y alguna farmacias pueden ofrecer precios ms baratos.  El sitio web www.goodrx.com tiene cupones para medicamentos de  Health and safety inspector. Los precios aqu no tienen en cuenta lo que podra costar con la ayuda del seguro (puede ser ms barato con su seguro), pero el sitio web puede darle el precio si no utiliz Tourist information centre manager.  - Puede imprimir el cupn correspondiente y llevarlo con su receta a la farmacia.  - Tambin puede pasar por nuestra oficina durante el horario de atencin regular y Education officer, museum una tarjeta de cupones  de GoodRx.  - Si necesita que su receta se enve electrnicamente a una farmacia diferente, informe a nuestra oficina a travs de MyChart de [place] o por telfono llamando al [phone] y presione la opcin 4.

## 2023-11-27 NOTE — Progress Notes (Signed)
 New Patient Visit   Subjective  Julia [name] is a 58 y.o. female who presents for the following: Skin Cancer Screening and Full Body Skin Exam. No personal Hx of skin cancer or dysplastic nevi. Has been seen in our office in the past but has been >3 years.   The patient presents for Total-Body Skin Exam (TBSE) for skin cancer screening and mole check. The patient has spots, moles and lesions to be evaluated, some may be new or changing and the patient may have concern these could be cancer.    The following portions of the chart were reviewed this encounter and updated as appropriate: medications, allergies, medical history  Review of Systems:  No other skin or systemic complaints except as noted in HPI or Assessment and Plan.  Objective  Well appearing patient in no apparent distress; mood and affect are within normal limits.  A full examination was performed including scalp, head, eyes, ears, nose, lips, neck, chest, axillae, abdomen, back, buttocks, bilateral upper extremities, bilateral lower extremities, hands, feet, fingers, toes, fingernails, and toenails. All findings within normal limits unless otherwise noted below.   Relevant physical exam findings are noted in the Assessment and Plan.  Exam of nails limited by presence of nail polish.  Right Anteromedial Upper Thigh 4 mm pink, slightly keratotic papule   Assessment & Plan   SKIN CANCER SCREENING PERFORMED TODAY.  ACTINIC DAMAGE - Chronic condition, secondary to cumulative UV/sun exposure - diffuse scaly erythematous macules with underlying dyspigmentation - Recommend daily broad spectrum sunscreen SPF 30+ to sun-exposed areas, reapply every 2 hours as needed.  - Staying in the shade or wearing long sleeves, sun glasses (UVA+UVB protection) and wide brim hats (4-inch brim around the entire circumference of the hat) are also recommended for sun protection.  - Call for new or changing lesions.  LENTIGINES,  SEBORRHEIC KERATOSES, HEMANGIOMAS - Benign normal skin lesions - Benign-appearing - Call for any changes  MELANOCYTIC NEVI - Tan-brown and/or pink-flesh-colored symmetric macules and papules - tan-light brown thin papule at left abdomen - Benign appearing on exam today - Observation - Call clinic for new or changing moles - Recommend daily use of broad spectrum spf 30+ sunscreen to sun-exposed areas.  - Check all nails when remove polish.   SEBORRHEIC KERATOSIS - Stuck-on, waxy, tan-brown papule at right cheek.  - Benign-appearing - Discussed benign etiology and prognosis. - Observe - Call for any changes   HEMANGIOMA Exam: red papules on abdomen Discussed benign nature. Recommend observation. Call for changes.    NEOPLASM OF SKIN Right Anteromedial Upper Thigh Epidermal / dermal shaving  Lesion diameter (cm):  0.4 Informed consent: discussed and consent obtained   Timeout: patient name, date of birth, surgical site, and procedure verified   Procedure prep:  Patient was prepped and draped in usual sterile fashion Prep type:  Isopropyl alcohol Anesthesia: the lesion was anesthetized in a standard fashion   Anesthetic:  1% lidocaine  w/ epinephrine  1-100,000 buffered w/ 8.4% NaHCO3 Instrument used: DermaBlade   Hemostasis achieved with: pressure and aluminum chloride   Outcome: patient tolerated procedure well   Post-procedure details: wound care instructions given    Specimen 1 - Surgical pathology Differential Diagnosis: Irritated acrochordon vs ISK vs irritated nevus   Check Margins: No MULTIPLE BENIGN NEVI   LENTIGINES   ACTINIC ELASTOSIS   SEBORRHEIC KERATOSES   CHERRY ANGIOMA    Return for TBSE in 1-2 years.  I, Jill Parcell, CMA, am acting as scribe  for Boneta Sharps, MD.   Documentation: I have reviewed the above documentation for accuracy and completeness, and I agree with the above.  Boneta Sharps, MD

## 2023-12-01 LAB — SURGICAL PATHOLOGY

## 2023-12-04 ENCOUNTER — Ambulatory Visit: Payer: Self-pay | Admitting: Dermatology

## 2024-11-26 ENCOUNTER — Ambulatory Visit: Admitting: Dermatology
